# Patient Record
Sex: Female | Born: 1985 | Race: White | Hispanic: No | Marital: Married | Smoking: Former smoker
Health system: Southern US, Community
[De-identification: ages and names within clinical notes are randomized; demographics above are authoritative.]

## PROBLEM LIST (undated history)

## (undated) DIAGNOSIS — Z1331 Encounter for screening for depression: Secondary | ICD-10-CM

## (undated) DIAGNOSIS — S46812A Strain of other muscles, fascia and tendons at shoulder and upper arm level, left arm, initial encounter: Secondary | ICD-10-CM

## (undated) DIAGNOSIS — S91119A Laceration without foreign body of unspecified toe without damage to nail, initial encounter: Secondary | ICD-10-CM

## (undated) DIAGNOSIS — K529 Noninfective gastroenteritis and colitis, unspecified: Secondary | ICD-10-CM

## (undated) DIAGNOSIS — N921 Excessive and frequent menstruation with irregular cycle: Secondary | ICD-10-CM

## (undated) DIAGNOSIS — R0789 Other chest pain: Secondary | ICD-10-CM

## (undated) DIAGNOSIS — F419 Anxiety disorder, unspecified: Secondary | ICD-10-CM

## (undated) DIAGNOSIS — J019 Acute sinusitis, unspecified: Secondary | ICD-10-CM

## (undated) DIAGNOSIS — R110 Nausea: Secondary | ICD-10-CM

## (undated) DIAGNOSIS — R55 Syncope and collapse: Secondary | ICD-10-CM

## (undated) DIAGNOSIS — N643 Galactorrhea not associated with childbirth: Secondary | ICD-10-CM

## (undated) DIAGNOSIS — H538 Other visual disturbances: Secondary | ICD-10-CM

## (undated) DIAGNOSIS — N939 Abnormal uterine and vaginal bleeding, unspecified: Secondary | ICD-10-CM

## (undated) DIAGNOSIS — R51 Headache: Secondary | ICD-10-CM

## (undated) DIAGNOSIS — Z720 Tobacco use: Secondary | ICD-10-CM

## (undated) DIAGNOSIS — N926 Irregular menstruation, unspecified: Secondary | ICD-10-CM

## (undated) DIAGNOSIS — R0781 Pleurodynia: Secondary | ICD-10-CM

## (undated) DIAGNOSIS — W57XXXA Bitten or stung by nonvenomous insect and other nonvenomous arthropods, initial encounter: Secondary | ICD-10-CM

## (undated) DIAGNOSIS — S43401A Unspecified sprain of right shoulder joint, initial encounter: Secondary | ICD-10-CM

## (undated) DIAGNOSIS — H11153 Pinguecula, bilateral: Secondary | ICD-10-CM

## (undated) DIAGNOSIS — R768 Other specified abnormal immunological findings in serum: Secondary | ICD-10-CM

## (undated) DIAGNOSIS — L723 Sebaceous cyst: Secondary | ICD-10-CM

## (undated) DIAGNOSIS — R011 Cardiac murmur, unspecified: Secondary | ICD-10-CM

## (undated) DIAGNOSIS — R002 Palpitations: Secondary | ICD-10-CM

## (undated) DIAGNOSIS — F329 Major depressive disorder, single episode, unspecified: Secondary | ICD-10-CM

## (undated) DIAGNOSIS — L918 Other hypertrophic disorders of the skin: Secondary | ICD-10-CM

## (undated) DIAGNOSIS — R519 Headache, unspecified: Secondary | ICD-10-CM

## (undated) DIAGNOSIS — R109 Unspecified abdominal pain: Secondary | ICD-10-CM

## (undated) DIAGNOSIS — N809 Endometriosis, unspecified: Secondary | ICD-10-CM

## (undated) DIAGNOSIS — K589 Irritable bowel syndrome without diarrhea: Secondary | ICD-10-CM

## (undated) DIAGNOSIS — N390 Urinary tract infection, site not specified: Secondary | ICD-10-CM

## (undated) DIAGNOSIS — R102 Pelvic and perineal pain: Secondary | ICD-10-CM

## (undated) DIAGNOSIS — M791 Myalgia, unspecified site: Secondary | ICD-10-CM

## (undated) HISTORY — DX: Tobacco use: Z72.0

## (undated) HISTORY — DX: Nausea: R11.0

## (undated) HISTORY — DX: Encounter for screening for depression: Z13.31

## (undated) HISTORY — DX: Other specified abnormal immunological findings in serum: R76.8

## (undated) HISTORY — DX: Other hypertrophic disorders of the skin: L91.8

## (undated) HISTORY — DX: Urinary tract infection, site not specified: N39.0

## (undated) HISTORY — DX: Other chest pain: R07.89

## (undated) HISTORY — DX: Pleurodynia: R07.81

## (undated) HISTORY — PX: OTHER SURGICAL HISTORY: SHX169

## (undated) HISTORY — DX: Irritable bowel syndrome without diarrhea: K58.9

## (undated) HISTORY — PX: TONSILLECTOMY: SUR1361

## (undated) HISTORY — DX: Noninfective gastroenteritis and colitis, unspecified: K52.9

## (undated) HISTORY — DX: Acute sinusitis, unspecified: J01.90

## (undated) HISTORY — DX: Sebaceous cyst: L72.3

## (undated) HISTORY — DX: Strain of other muscles, fascia and tendons at shoulder and upper arm level, left arm, initial encounter: S46.812A

## (undated) HISTORY — DX: Headache: R51

## (undated) HISTORY — DX: Unspecified sprain of right shoulder joint, initial encounter: S43.401A

## (undated) HISTORY — DX: Anxiety disorder, unspecified: F41.9

## (undated) HISTORY — DX: Irregular menstruation, unspecified: N92.6

## (undated) HISTORY — PX: WISDOM TOOTH EXTRACTION: SHX21

## (undated) HISTORY — DX: Bitten or stung by nonvenomous insect and other nonvenomous arthropods, initial encounter: W57.XXXA

## (undated) HISTORY — DX: Endometriosis, unspecified: N80.9

## (undated) HISTORY — DX: Syncope and collapse: R55

## (undated) HISTORY — DX: Pelvic and perineal pain: R10.2

## (undated) HISTORY — DX: Palpitations: R00.2

## (undated) HISTORY — DX: Laceration without foreign body of unspecified toe without damage to nail, initial encounter: S91.119A

## (undated) HISTORY — DX: Other visual disturbances: H53.8

## (undated) HISTORY — DX: Galactorrhea not associated with childbirth: N64.3

## (undated) HISTORY — DX: Excessive and frequent menstruation with irregular cycle: N92.1

## (undated) HISTORY — DX: Major depressive disorder, single episode, unspecified: F32.9

## (undated) HISTORY — DX: Headache, unspecified: R51.9

## (undated) HISTORY — DX: Myalgia, unspecified site: M79.10

## (undated) HISTORY — DX: Pinguecula, bilateral: H11.153

## (undated) HISTORY — DX: Abnormal uterine and vaginal bleeding, unspecified: N93.9

---
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
## 2007-01-01 ENCOUNTER — Encounter: Admission: RE | Admit: 2007-01-01 | Discharge: 2007-01-01 | Payer: Self-pay | Admitting: Family Medicine

## 2007-01-08 ENCOUNTER — Emergency Department (HOSPITAL_COMMUNITY): Admission: EM | Admit: 2007-01-08 | Discharge: 2007-01-08 | Payer: Self-pay | Admitting: Emergency Medicine

## 2007-02-28 ENCOUNTER — Encounter: Admission: RE | Admit: 2007-02-28 | Discharge: 2007-02-28 | Payer: Self-pay | Admitting: Family Medicine

## 2007-07-17 ENCOUNTER — Emergency Department (HOSPITAL_COMMUNITY): Admission: EM | Admit: 2007-07-17 | Discharge: 2007-07-17 | Payer: Self-pay | Admitting: Emergency Medicine

## 2007-08-04 ENCOUNTER — Emergency Department (HOSPITAL_COMMUNITY): Admission: EM | Admit: 2007-08-04 | Discharge: 2007-08-04 | Payer: Self-pay | Admitting: Emergency Medicine

## 2007-12-02 ENCOUNTER — Emergency Department (HOSPITAL_COMMUNITY): Admission: EM | Admit: 2007-12-02 | Discharge: 2007-12-02 | Payer: Self-pay | Admitting: Emergency Medicine

## 2008-01-20 ENCOUNTER — Emergency Department (HOSPITAL_COMMUNITY): Admission: EM | Admit: 2008-01-20 | Discharge: 2008-01-20 | Payer: Self-pay | Admitting: Emergency Medicine

## 2008-03-29 ENCOUNTER — Emergency Department (HOSPITAL_COMMUNITY): Admission: EM | Admit: 2008-03-29 | Discharge: 2008-03-29 | Payer: Self-pay | Admitting: Emergency Medicine

## 2009-06-17 ENCOUNTER — Emergency Department (HOSPITAL_COMMUNITY): Admission: EM | Admit: 2009-06-17 | Discharge: 2009-06-17 | Payer: Self-pay | Admitting: Emergency Medicine

## 2010-06-18 ENCOUNTER — Ambulatory Visit (HOSPITAL_COMMUNITY): Admission: RE | Admit: 2010-06-18 | Discharge: 2010-06-18 | Payer: Self-pay | Admitting: Obstetrics and Gynecology

## 2010-07-25 ENCOUNTER — Emergency Department (HOSPITAL_COMMUNITY): Admission: EM | Admit: 2010-07-25 | Discharge: 2010-07-25 | Payer: Self-pay | Admitting: Emergency Medicine

## 2010-10-23 ENCOUNTER — Inpatient Hospital Stay (HOSPITAL_COMMUNITY)
Admission: AD | Admit: 2010-10-23 | Discharge: 2010-10-23 | Payer: Self-pay | Source: Home / Self Care | Admitting: Obstetrics & Gynecology

## 2010-11-01 ENCOUNTER — Inpatient Hospital Stay (HOSPITAL_COMMUNITY)
Admission: AD | Admit: 2010-11-01 | Discharge: 2010-11-03 | Payer: Self-pay | Source: Home / Self Care | Attending: Obstetrics & Gynecology | Admitting: Obstetrics & Gynecology

## 2010-12-12 ENCOUNTER — Encounter: Payer: Self-pay | Admitting: Family Medicine

## 2011-01-31 LAB — CBC
HCT: 27.1 % — ABNORMAL LOW (ref 36.0–46.0)
Hemoglobin: 11.8 g/dL — ABNORMAL LOW (ref 12.0–15.0)
Hemoglobin: 9.5 g/dL — ABNORMAL LOW (ref 12.0–15.0)
MCH: 30.8 pg (ref 26.0–34.0)
MCH: 31.7 pg (ref 26.0–34.0)
MCHC: 35 g/dL (ref 30.0–36.0)
MCV: 90.1 fL (ref 78.0–100.0)
MCV: 90.6 fL (ref 78.0–100.0)
RBC: 3.82 MIL/uL — ABNORMAL LOW (ref 3.87–5.11)

## 2011-01-31 LAB — URINALYSIS, ROUTINE W REFLEX MICROSCOPIC
Bilirubin Urine: NEGATIVE
Hgb urine dipstick: NEGATIVE
Ketones, ur: NEGATIVE mg/dL
Specific Gravity, Urine: <1.005 — ABNORMAL LOW (ref 1.005–1.030)
pH: 7 (ref 5.0–8.0)

## 2011-02-03 LAB — WET PREP, GENITAL
WBC, Wet Prep HPF POC: NONE SEEN
Yeast Wet Prep HPF POC: NONE SEEN

## 2011-02-03 LAB — URINALYSIS, ROUTINE W REFLEX MICROSCOPIC
Bilirubin Urine: NEGATIVE
Glucose, UA: NEGATIVE mg/dL
Hgb urine dipstick: NEGATIVE
Ketones, ur: NEGATIVE mg/dL
Protein, ur: NEGATIVE mg/dL

## 2011-02-03 LAB — DIFFERENTIAL
Basophils Absolute: 0 10*3/uL (ref 0.0–0.1)
Basophils Relative: 0 % (ref 0–1)
Monocytes Relative: 4 % (ref 3–12)
Neutro Abs: 7.8 10*3/uL — ABNORMAL HIGH (ref 1.7–7.7)
Neutrophils Relative %: 81 % — ABNORMAL HIGH (ref 43–77)

## 2011-02-03 LAB — BASIC METABOLIC PANEL
Calcium: 8.6 mg/dL (ref 8.4–10.5)
GFR calc non Af Amer: >60 mL/min (ref >60)
Glucose, Bld: 79 mg/dL (ref 70–99)
Sodium: 136 mEq/L (ref 135–145)

## 2011-02-03 LAB — CBC
Hemoglobin: 10.6 g/dL — ABNORMAL LOW (ref 12.0–15.0)
MCHC: 34.8 g/dL (ref 30.0–36.0)
RDW: 12.6 % (ref 11.5–15.5)

## 2011-02-03 LAB — GC/CHLAMYDIA PROBE AMP, GENITAL: GC Probe Amp, Genital: NEGATIVE

## 2011-07-29 ENCOUNTER — Other Ambulatory Visit: Payer: Self-pay | Admitting: Emergency Medicine

## 2011-07-29 ENCOUNTER — Emergency Department (HOSPITAL_COMMUNITY)
Admission: EM | Admit: 2011-07-29 | Discharge: 2011-07-30 | Disposition: A | Discharge status: Home / Self Care | Payer: Medicaid Other | Attending: Emergency Medicine | Admitting: Emergency Medicine

## 2011-07-29 DIAGNOSIS — R5381 Other malaise: Secondary | ICD-10-CM | POA: Insufficient documentation

## 2011-07-29 DIAGNOSIS — N898 Other specified noninflammatory disorders of vagina: Secondary | ICD-10-CM | POA: Insufficient documentation

## 2011-07-29 DIAGNOSIS — R109 Unspecified abdominal pain: Secondary | ICD-10-CM | POA: Insufficient documentation

## 2011-07-29 DIAGNOSIS — M545 Low back pain, unspecified: Secondary | ICD-10-CM | POA: Insufficient documentation

## 2011-07-29 LAB — POCT PREGNANCY, URINE: Preg Test, Ur: NEGATIVE

## 2011-08-01 LAB — URINALYSIS, ROUTINE W REFLEX MICROSCOPIC
Bilirubin Urine: NEGATIVE
Glucose, UA: NEGATIVE mg/dL
Ketones, ur: NEGATIVE mg/dL
Leukocytes, UA: NEGATIVE
Nitrite: NEGATIVE
Protein, ur: NEGATIVE mg/dL
Specific Gravity, Urine: 1.012 (ref 1.005–1.030)
Urobilinogen, UA: 0.2 mg/dL (ref 0.0–1.0)
pH: 6.5 (ref 5.0–8.0)

## 2011-08-01 LAB — PROTIME-INR
INR: 1.14 (ref 0.00–1.49)
Prothrombin Time: 14.8 s (ref 11.6–15.2)

## 2011-08-01 LAB — DIFFERENTIAL
Basophils Absolute: 0.1 K/uL (ref 0.0–0.1)
Basophils Relative: 1 % (ref 0–1)
Eosinophils Absolute: 0.2 K/uL (ref 0.0–0.7)
Eosinophils Relative: 3 % (ref 0–5)
Lymphocytes Relative: 44 % (ref 12–46)
Lymphs Abs: 2.6 K/uL (ref 0.7–4.0)
Monocytes Absolute: 0.3 K/uL (ref 0.1–1.0)
Monocytes Relative: 6 % (ref 3–12)
Neutro Abs: 2.8 K/uL (ref 1.7–7.7)
Neutrophils Relative %: 47 % (ref 43–77)

## 2011-08-01 LAB — CBC
HCT: 35.4 % — ABNORMAL LOW (ref 36.0–46.0)
Hemoglobin: 12.6 g/dL (ref 12.0–15.0)
MCH: 30.1 pg (ref 26.0–34.0)
MCV: 84.7 fL (ref 78.0–100.0)
RBC: 4.18 MIL/uL (ref 3.87–5.11)

## 2011-08-01 LAB — URINE MICROSCOPIC-ADD ON

## 2011-08-01 LAB — GC/CHLAMYDIA PROBE AMP, GENITAL: GC Probe Amp, Genital: NEGATIVE

## 2011-08-15 LAB — WET PREP, GENITAL: Yeast Wet Prep HPF POC: NONE SEEN

## 2011-08-15 LAB — POCT PREGNANCY, URINE
Operator id: 284141
Preg Test, Ur: NEGATIVE

## 2011-08-15 LAB — CBC
HCT: 36.6
MCHC: 34.2
MCV: 87.9
Platelets: 272
RDW: 12.7
WBC: 10

## 2011-08-15 LAB — URINALYSIS, ROUTINE W REFLEX MICROSCOPIC
Ketones, ur: NEGATIVE
Nitrite: NEGATIVE
Specific Gravity, Urine: 1.015
Urobilinogen, UA: 0.2
pH: 7.5

## 2011-08-15 LAB — COMPREHENSIVE METABOLIC PANEL
Albumin: 3.9
BUN: 5 — ABNORMAL LOW
Calcium: 9
Chloride: 109
Creatinine, Ser: 0.66
Total Bilirubin: 0.5

## 2011-08-15 LAB — DIFFERENTIAL
Basophils Absolute: 0
Lymphocytes Relative: 16
Monocytes Absolute: 0.5
Neutro Abs: 7.8 — ABNORMAL HIGH

## 2011-08-15 LAB — RPR: RPR Ser Ql: NONREACTIVE

## 2011-08-15 LAB — GC/CHLAMYDIA PROBE AMP, GENITAL: GC Probe Amp, Genital: NEGATIVE

## 2011-09-02 LAB — DIFFERENTIAL
Lymphocytes Relative: 20
Lymphs Abs: 1.6
Neutrophils Relative %: 71

## 2011-09-02 LAB — PREGNANCY, URINE: Preg Test, Ur: NEGATIVE

## 2011-09-02 LAB — I-STAT 8, (EC8 V) (CONVERTED LAB)
BUN: 10
Bicarbonate: 23.2
HCT: 39
Hemoglobin: 13.3
Operator id: 277751
pCO2, Ven: 33.8 — ABNORMAL LOW

## 2011-09-02 LAB — URINALYSIS, ROUTINE W REFLEX MICROSCOPIC
Nitrite: NEGATIVE
Specific Gravity, Urine: 1.017
pH: 6

## 2011-09-02 LAB — CBC
HCT: 35.3 — ABNORMAL LOW
Platelets: 263
WBC: 7.9

## 2011-09-02 LAB — POCT I-STAT CREATININE: Creatinine, Ser: 0.8

## 2012-12-03 ENCOUNTER — Encounter (HOSPITAL_COMMUNITY): Payer: Self-pay

## 2012-12-03 ENCOUNTER — Emergency Department (HOSPITAL_COMMUNITY)
Admission: EM | Admit: 2012-12-03 | Discharge: 2012-12-03 | Disposition: A | Discharge status: Home / Self Care | Payer: Self-pay | Attending: Emergency Medicine | Admitting: Emergency Medicine

## 2012-12-03 ENCOUNTER — Emergency Department (HOSPITAL_COMMUNITY): Payer: Self-pay

## 2012-12-03 DIAGNOSIS — IMO0002 Reserved for concepts with insufficient information to code with codable children: Secondary | ICD-10-CM

## 2012-12-03 DIAGNOSIS — F17200 Nicotine dependence, unspecified, uncomplicated: Secondary | ICD-10-CM | POA: Insufficient documentation

## 2012-12-03 DIAGNOSIS — Y9389 Activity, other specified: Secondary | ICD-10-CM | POA: Insufficient documentation

## 2012-12-03 DIAGNOSIS — R011 Cardiac murmur, unspecified: Secondary | ICD-10-CM | POA: Insufficient documentation

## 2012-12-03 DIAGNOSIS — S51809A Unspecified open wound of unspecified forearm, initial encounter: Secondary | ICD-10-CM | POA: Insufficient documentation

## 2012-12-03 DIAGNOSIS — Y929 Unspecified place or not applicable: Secondary | ICD-10-CM | POA: Insufficient documentation

## 2012-12-03 DIAGNOSIS — W268XXA Contact with other sharp object(s), not elsewhere classified, initial encounter: Secondary | ICD-10-CM | POA: Insufficient documentation

## 2012-12-03 HISTORY — DX: Cardiac murmur, unspecified: R01.1

## 2012-12-03 MED ORDER — HYDROCODONE-ACETAMINOPHEN 5-325 MG PO TABS: 2.0000 | ORAL_TABLET | ORAL | Status: DC | PRN

## 2012-12-03 NOTE — ED Provider Notes (Signed)
History   This chart was scribed for Doug Sou, MD by Gerlean Ren, ED Scribe. This patient was seen in room TR08C/TR08C and the patient's care was started at 9:50 PM    CSN: [id number]  Arrival date & time 12/03/12  2040   First MD Initiated Contact with Patient 12/03/12 2148      Chief Complaint  Patient presents with  . Extremity Laceration    The history is provided by the patient. No language interpreter was used.   Krista Rodriguez is a 27 y.o. female with no chronic medical conditions who presents to the Emergency Department for a laceration over right forearm after punching through a glass window at 8:00 PM this evening.  Pt states tetanus is up-to-date.  Pt denies any further injuries as a result.  Pt is a current someday smoker and reports alcohol use. Past Medical History  Diagnosis Date  . Heart murmur     Past Surgical History  Procedure Date  . Tonsillectomy   . Addenoidectomy     History reviewed. No pertinent family history.  History  Substance Use Topics  . Smoking status: Current Some Day Smoker  . Smokeless tobacco: Not on file  . Alcohol Use: Yes    No OB history provided.   Review of Systems  Constitutional: Negative.   HENT: Negative.   Respiratory: Negative.   Cardiovascular: Negative.   Gastrointestinal: Negative.   Musculoskeletal: Negative.   Skin: Positive for wound.  Neurological: Negative.   Hematological: Negative.   Psychiatric/Behavioral: Negative.     Allergies  Icy hot  Home Medications  No current outpatient prescriptions on file.  BP 115/82  Pulse 80  Temp 98.3 F (36.8 C) (Oral)  Resp 16  SpO2 100%  LMP 11/22/2012  Physical Exam  Nursing note and vitals reviewed. Constitutional: She appears well-developed and well-nourished.  HENT:  Head: Normocephalic and atraumatic.  Eyes: Conjunctivae normal are normal. Pupils are equal, round, and reactive to light.  Neck: Neck supple. No tracheal deviation present. No  thyromegaly present.  Cardiovascular: Normal rate and regular rhythm.   No murmur heard. Pulmonary/Chest: Effort normal and breath sounds normal.  Abdominal: Soft. Bowel sounds are normal. She exhibits no distension. There is no tenderness.  Musculoskeletal: Normal range of motion. She exhibits no edema and no tenderness.       Right upper extremity 5 cm laceration with muscle at base full range of motion neurovascularly intact  Neurological: She is alert. Coordination normal.  Skin: Skin is warm and dry. No rash noted.  Psychiatric: She has a normal mood and affect.    ED Course  Procedures (including critical care time) DIAGNOSTIC STUDIES: Oxygen Saturation is 100% on room air, normal by my interpretation.    COORDINATION OF CARE: 9:54 PM- Patient informed of clinical course, understands medical decision-making process, and agrees with plan.   No results found.   No diagnosis found.   X-rays reviewed by me  LACERATION REPAIR Performed by: Doug Sou Authorized by: Doug Sou Consent: Verbal consent obtained. Risks and benefits: risks, benefits and alternatives were discussed Consent given by: patient Patient identity confirmed: provided demographic data Prepped and Draped in normal sterile fashion Wound explored  Laceration Location: Right forearm  Laceration Length: 5cm  No Foreign Bodies seen or palpated  Anesthesia: local infiltration  Local anesthetic: lidocaine2% without epinephrine  Anesthetic total: 3 ml  Irrigation method: Copious tap water  Amount of cleaning: standard  Skin closure: 4-0 prolene  Number of sutures:  5  Technique: Simple interrupted   Patient tolerance: Patient tolerated the procedure well with no immediate complications. MDM  Plan sutures out one week. Prescription Norco. Diagnosis  5 cmlaceration right forearm  I personally performed the services described in this documentation, which was scribed in my presence. The  recorded information has been reviewed and is accurate.         Doug Sou, MD 12/03/12 2259

## 2012-12-03 NOTE — ED Notes (Signed)
Cleaned wound to right anterior forearm with EZ clean spray and covered with saline gauze.

## 2012-12-03 NOTE — ED Notes (Addendum)
Pt reports she was upset and punched her hand through a window approx 45 mins ago, pt presents w/approx 4 cm lac in length and 1 cm in width to (R) forearm, bleeding controlled, area dressed. Positive sensation and pulse below extremity

## 2013-09-06 ENCOUNTER — Encounter (HOSPITAL_COMMUNITY): Payer: Self-pay | Admitting: Emergency Medicine

## 2013-09-06 ENCOUNTER — Emergency Department (HOSPITAL_COMMUNITY)
Admission: EM | Admit: 2013-09-06 | Discharge: 2013-09-06 | Disposition: A | Discharge status: Home / Self Care | Payer: Medicaid Other | Attending: Emergency Medicine | Admitting: Emergency Medicine

## 2013-09-06 DIAGNOSIS — R51 Headache: Secondary | ICD-10-CM | POA: Insufficient documentation

## 2013-09-06 DIAGNOSIS — F17200 Nicotine dependence, unspecified, uncomplicated: Secondary | ICD-10-CM | POA: Insufficient documentation

## 2013-09-06 DIAGNOSIS — R011 Cardiac murmur, unspecified: Secondary | ICD-10-CM | POA: Insufficient documentation

## 2013-09-06 MED ORDER — METOCLOPRAMIDE HCL 10 MG PO TABS: 10.0000 mg | ORAL_TABLET | Freq: Four times a day (QID) | ORAL | Status: DC | PRN

## 2013-09-06 NOTE — ED Notes (Signed)
Head started burning and getting tightly, foggy vision; h/a x 1 week. - migraine x 1 mos. Also, sob.

## 2013-09-06 NOTE — ED Provider Notes (Signed)
CSN: [id number]     Arrival date & time 09/06/13  0100 History   First MD Initiated Contact with Patient 09/06/13 0159     Chief Complaint  Patient presents with  . Headache   (Consider location/radiation/quality/duration/timing/severity/associated sxs/prior Treatment) HPI Over last year gets headaches once a month, this one gradual onset several days was mild worse tonight moderate, no treatment PTA, sometimes takes Exedrin migraine which helps, no sudden onset, no trauma, no fever, no focal neuro Sxs Past Medical History  Diagnosis Date  . Heart murmur    Past Surgical History  Procedure Laterality Date  . Tonsillectomy    . Addenoidectomy     History reviewed. No pertinent family history. History  Substance Use Topics  . Smoking status: Current Some Day Smoker  . Smokeless tobacco: Not on file  . Alcohol Use: Yes   OB History   Grav Para Term Preterm Abortions TAB SAB Ect Mult Living                 Review of Systems 10 Systems reviewed and are negative for acute change except as noted in the HPI. Allergies  Icy hot  Home Medications   Current Outpatient Rx  Name  Route  Sig  Dispense  Refill  . HYDROcodone-acetaminophen (NORCO/VICODIN) 5-325 MG per tablet   Oral   Take 2 tablets by mouth every 4 (four) hours as needed for pain.   6 tablet   0   . metoCLOPramide (REGLAN) 10 MG tablet   Oral   Take 1 tablet (10 mg total) by mouth every 6 (six) hours as needed (nausea/headache).   4 tablet   0    BP 105/55  Pulse 52  Temp(Src) 97.7 F (36.5 C) (Oral)  Resp 18  SpO2 100% Physical Exam  Nursing note and vitals reviewed. Constitutional:  Awake, alert, nontoxic appearance with baseline speech for patient.  HENT:  Head: Atraumatic.  Mouth/Throat: No oropharyngeal exudate.  Eyes: EOM are normal. Pupils are equal, round, and reactive to light. Right eye exhibits no discharge. Left eye exhibits no discharge.  Neck: Neck supple.  Cardiovascular: Normal  rate and regular rhythm.   No murmur heard. Pulmonary/Chest: Effort normal and breath sounds normal. No stridor. No respiratory distress. She has no wheezes. She has no rales. She exhibits no tenderness.  Abdominal: Soft. Bowel sounds are normal. She exhibits no mass. There is no tenderness. There is no rebound.  Musculoskeletal: She exhibits no tenderness.  Baseline ROM, moves extremities with no obvious new focal weakness.  Lymphadenopathy:    She has no cervical adenopathy.  Neurological:  Awake, alert, cooperative and aware of situation; motor strength bilaterally; sensation normal to light touch bilaterally; peripheral visual fields full to confrontation; no facial asymmetry; tongue midline; major cranial nerves appear intact; no pronator drift, normal finger to nose bilaterally, baseline gait without new ataxia.  Skin: No rash noted.  Psychiatric: She has a normal mood and affect.    ED Course  Procedures (including critical care time) Labs Review Labs Reviewed - No data to display Imaging Review No results found.  EKG Interpretation   None       MDM   1. Headache    I doubt any other EMC precluding discharge at this time including, but not necessarily limited to the following:SAH, CVA, SBI.    Hurman Horn, MD 09/22/13 2132

## 2014-08-15 ENCOUNTER — Other Ambulatory Visit: Payer: Self-pay | Admitting: Family Medicine

## 2014-08-15 DIAGNOSIS — N920 Excessive and frequent menstruation with regular cycle: Secondary | ICD-10-CM

## 2014-08-21 ENCOUNTER — Ambulatory Visit
Admission: RE | Admit: 2014-08-21 | Discharge: 2014-08-21 | Disposition: A | Discharge status: Home / Self Care | Payer: 59 | Source: Ambulatory Visit | Attending: Family Medicine | Admitting: Family Medicine

## 2014-08-21 DIAGNOSIS — N920 Excessive and frequent menstruation with regular cycle: Secondary | ICD-10-CM

## 2014-10-23 ENCOUNTER — Other Ambulatory Visit: Payer: Self-pay | Admitting: Occupational Medicine

## 2014-10-23 ENCOUNTER — Ambulatory Visit: Payer: Self-pay

## 2014-10-23 DIAGNOSIS — M79671 Pain in right foot: Secondary | ICD-10-CM

## 2016-01-21 ENCOUNTER — Encounter (HOSPITAL_COMMUNITY): Payer: Self-pay | Admitting: Emergency Medicine

## 2016-01-21 ENCOUNTER — Emergency Department (HOSPITAL_COMMUNITY)
Admission: EM | Admit: 2016-01-21 | Discharge: 2016-01-21 | Disposition: A | Discharge status: Home / Self Care | Payer: BLUE CROSS/BLUE SHIELD | Attending: Emergency Medicine | Admitting: Emergency Medicine

## 2016-01-21 DIAGNOSIS — Z79899 Other long term (current) drug therapy: Secondary | ICD-10-CM | POA: Diagnosis not present

## 2016-01-21 DIAGNOSIS — F17200 Nicotine dependence, unspecified, uncomplicated: Secondary | ICD-10-CM | POA: Diagnosis not present

## 2016-01-21 DIAGNOSIS — R1030 Lower abdominal pain, unspecified: Secondary | ICD-10-CM | POA: Insufficient documentation

## 2016-01-21 DIAGNOSIS — R011 Cardiac murmur, unspecified: Secondary | ICD-10-CM | POA: Insufficient documentation

## 2016-01-21 DIAGNOSIS — R55 Syncope and collapse: Secondary | ICD-10-CM | POA: Diagnosis not present

## 2016-01-21 DIAGNOSIS — Z3202 Encounter for pregnancy test, result negative: Secondary | ICD-10-CM | POA: Diagnosis not present

## 2016-01-21 HISTORY — DX: Unspecified abdominal pain: R10.9

## 2016-01-21 LAB — CBC WITH DIFFERENTIAL/PLATELET
BASOS ABS: 0 10*3/uL (ref 0.0–0.1)
Basophils Relative: 0 %
EOS PCT: 1 %
Eosinophils Absolute: 0.1 10*3/uL (ref 0.0–0.7)
HCT: 37.4 % (ref 36.0–46.0)
Hemoglobin: 12.7 g/dL (ref 12.0–15.0)
LYMPHS ABS: 2.2 10*3/uL (ref 0.7–4.0)
LYMPHS PCT: 23 %
MCH: 29.1 pg (ref 26.0–34.0)
MCHC: 34 g/dL (ref 30.0–36.0)
MCV: 85.6 fL (ref 78.0–100.0)
MONO ABS: 0.5 10*3/uL (ref 0.1–1.0)
Monocytes Relative: 6 %
Neutro Abs: 6.4 10*3/uL (ref 1.7–7.7)
Neutrophils Relative %: 70 %
PLATELETS: 290 10*3/uL (ref 150–400)
RBC: 4.37 MIL/uL (ref 3.87–5.11)
RDW: 13.2 % (ref 11.5–15.5)
WBC: 9.2 10*3/uL (ref 4.0–10.5)

## 2016-01-21 LAB — COMPREHENSIVE METABOLIC PANEL
ALT: 13 U/L — ABNORMAL LOW (ref 14–54)
AST: 15 U/L (ref 15–41)
Albumin: 3.6 g/dL (ref 3.5–5.0)
Alkaline Phosphatase: 48 U/L (ref 38–126)
Anion gap: 10 (ref 5–15)
BUN: 13 mg/dL (ref 6–20)
CALCIUM: 9 mg/dL (ref 8.9–10.3)
CHLORIDE: 106 mmol/L (ref 101–111)
CO2: 22 mmol/L (ref 22–32)
Creatinine, Ser: 0.7 mg/dL (ref 0.44–1.00)
GLUCOSE: 81 mg/dL (ref 65–99)
POTASSIUM: 3.8 mmol/L (ref 3.5–5.1)
Sodium: 138 mmol/L (ref 135–145)
Total Bilirubin: 0.4 mg/dL (ref 0.3–1.2)
Total Protein: 6.2 g/dL — ABNORMAL LOW (ref 6.5–8.1)

## 2016-01-21 LAB — URINALYSIS, ROUTINE W REFLEX MICROSCOPIC
BILIRUBIN URINE: NEGATIVE
Glucose, UA: NEGATIVE mg/dL
Hgb urine dipstick: NEGATIVE
KETONES UR: NEGATIVE mg/dL
LEUKOCYTES UA: NEGATIVE
NITRITE: NEGATIVE
PH: 6 (ref 5.0–8.0)
Protein, ur: NEGATIVE mg/dL
SPECIFIC GRAVITY, URINE: 1.012 (ref 1.005–1.030)

## 2016-01-21 LAB — WET PREP, GENITAL
Clue Cells Wet Prep HPF POC: NONE SEEN
Sperm: NONE SEEN
TRICH WET PREP: NONE SEEN
YEAST WET PREP: NONE SEEN

## 2016-01-21 LAB — LIPASE, BLOOD: Lipase: 24 U/L (ref 11–51)

## 2016-01-21 LAB — POC URINE PREG, ED: PREG TEST UR: NEGATIVE

## 2016-01-21 MED ORDER — SODIUM CHLORIDE 0.9 % IV BOLUS (SEPSIS)
1000.0000 mL | Freq: Once | INTRAVENOUS | Status: AC
  Administered 2016-01-21: 1000 mL via INTRAVENOUS

## 2016-01-21 NOTE — ED Notes (Signed)
Pt arrives by [facility] post syncopal episode. About an hour ago, pt had extreme episode of lower abdominal pain and went to bathroom. She had a hot flash and next thing she remembers was boyfriend finding her. Pt c/o of headache and abdominal tenderness for EMS. 12 lead unremarkable, last vitals 108/72, HR 86, 100% RA, CBG 86. EMS notes that pt is seeing an MD regarding abdominal pain, but pt has never had a syncopal episode related to the pain.

## 2016-01-21 NOTE — ED Provider Notes (Signed)
CSN: [id number]     Arrival date & time 01/21/16  0500 History   First MD Initiated Contact with Patient 01/21/16 [phone]     Chief Complaint  Patient presents with  . Loss of Consciousness  . Abdominal Pain     (Consider location/radiation/quality/duration/timing/severity/associated sxs/prior Treatment) HPI  This is a 30 year old female with a history of chronic abdominal pain who presents with an episode of syncope. Patient reports that she had onset of acute abdominal pain this morning. It was over her lower abdomen and sharp in nature.  She states currently she just feels "sore." No vomiting or diarrhea. She has a history of similar pain in the past which has been attributed to likely endometriosis. She's not had pain in months. She states that she went to go urinate. She felt hot and the next thing she remembers was her boyfriend finding her. She was alert when she woke up.  Denied any chest pain or shortness of breath. Denies any vaginal discharge or concern for STDs. Last menstrual period was February 14.  Past Medical History  Diagnosis Date  . Heart murmur   . Abdominal pain    Past Surgical History  Procedure Laterality Date  . Tonsillectomy    . Addenoidectomy     History reviewed. No pertinent family history. Social History  Substance Use Topics  . Smoking status: Current Some Day Smoker  . Smokeless tobacco: None  . Alcohol Use: Yes   OB History    No data available     Review of Systems  Constitutional: Negative for fever.  Respiratory: Negative for shortness of breath.   Cardiovascular: Negative for chest pain.  Gastrointestinal: Positive for abdominal pain. Negative for nausea, vomiting and blood in stool.  Genitourinary: Negative for dysuria, vaginal bleeding and vaginal discharge.  All other systems reviewed and are negative.     Allergies  Icy hot  Home Medications   Prior to Admission medications   Medication Sig Start Date End Date Taking?  Authorizing Provider  norethindrone (ERRIN) 0.35 MG tablet Take 1 tablet by mouth daily.   Yes Historical Provider, MD   BP 106/60 mmHg  Pulse 63  Resp 20  SpO2 99%  LMP 01/05/2016 Physical Exam  Constitutional: She is oriented to person, place, and time. She appears well-developed and well-nourished. No distress.  HENT:  Head: Normocephalic and atraumatic.  Cardiovascular: Normal rate and regular rhythm.   Murmur heard. Pulmonary/Chest: Effort normal and breath sounds normal. No respiratory distress. She has no wheezes.  Abdominal: Soft. Bowel sounds are normal. There is tenderness. There is no rebound and no guarding.  Mild lower abdominal tenderness to palpation without rebound or guarding  Genitourinary:  Normal external vaginal exam, no significant vaginal discharge, multi parous cervix, no adnexal tenderness, there is tenderness to palpation with a patient of the posterior fornix, no cervical motion tenderness  Neurological: She is alert and oriented to person, place, and time.  Skin: Skin is warm and dry.  Psychiatric: She has a normal mood and affect.  Nursing note and vitals reviewed.   ED Course  Procedures (including critical care time) Labs Review Labs Reviewed  COMPREHENSIVE METABOLIC PANEL - Abnormal; Notable for the following:    Total Protein 6.2 (*)    ALT 13 (*)    All other components within normal limits  WET PREP, GENITAL  CBC WITH DIFFERENTIAL/PLATELET  LIPASE, BLOOD  URINALYSIS, ROUTINE W REFLEX MICROSCOPIC (NOT AT [facility])  POC URINE PREG, ED  GC/CHLAMYDIA  PROBE AMP ([place]) NOT AT [facility]    Imaging Review No results found. I have personally reviewed and evaluated these images and lab results as part of my medical decision-making.   EKG Interpretation   Date/Time:  Thursday January 21 2016 05:29:29 EST Ventricular Rate:  65 PR Interval:  189 QRS Duration: 92 QT Interval:  393 QTC Calculation: 409 R Axis:   81 Text Interpretation:  Sinus  rhythm Confirmed by Evolet Salminen  MD, Danyell Shader  ([id number]) on 01/21/2016 5:59:36 AM      MDM   Final diagnoses:  Vasovagal syncope  Lower abdominal pain    Patient presents with episode of syncope. She has a history of chronic lower abdominal pain. She had an episode is morning that was similar to prior episodes. She had a syncopal episode in the bathroom. She did have a prodrome of feeling warm. She denied chest pain or shortness breath. Vital signs are reassuring. EKG shows no evidence of arrhythmia. Currently she states that she is fairly comfortable regarding her abdominal pain.  Orthostatics are negative. Basic labwork shows no evidence of anemia and CMP and lipase are reassuring. Patient was tested for STDs. Discussed with the patient that I felt her syncope was likely related to vasovagal episode. Regarding her chronic abdominal pain, would have her follow up with her GYN for further management if pain persists. No indication for imaging at this time.  After history, exam, and medical workup I feel the patient has been appropriately medically screened and is safe for discharge home. Pertinent diagnoses were discussed with the patient. Patient was given return precautions.   Shon Baton, MD 01/21/16 [phone]

## 2016-01-21 NOTE — Discharge Instructions (Signed)

## 2016-01-22 LAB — GC/CHLAMYDIA PROBE AMP (~~LOC~~) NOT AT ARMC
CHLAMYDIA, DNA PROBE: NEGATIVE
NEISSERIA GONORRHEA: NEGATIVE

## 2016-04-25 ENCOUNTER — Emergency Department (HOSPITAL_COMMUNITY)
Admission: EM | Admit: 2016-04-25 | Discharge: 2016-04-25 | Disposition: A | Discharge status: Home / Self Care | Payer: Medicaid Other | Attending: Emergency Medicine | Admitting: Emergency Medicine

## 2016-04-25 ENCOUNTER — Encounter (HOSPITAL_COMMUNITY): Payer: Self-pay | Admitting: Emergency Medicine

## 2016-04-25 ENCOUNTER — Emergency Department (HOSPITAL_COMMUNITY): Payer: Medicaid Other

## 2016-04-25 DIAGNOSIS — M62838 Other muscle spasm: Secondary | ICD-10-CM | POA: Diagnosis not present

## 2016-04-25 DIAGNOSIS — F17210 Nicotine dependence, cigarettes, uncomplicated: Secondary | ICD-10-CM | POA: Insufficient documentation

## 2016-04-25 DIAGNOSIS — R51 Headache: Secondary | ICD-10-CM | POA: Insufficient documentation

## 2016-04-25 DIAGNOSIS — Z79899 Other long term (current) drug therapy: Secondary | ICD-10-CM | POA: Diagnosis not present

## 2016-04-25 DIAGNOSIS — R519 Headache, unspecified: Secondary | ICD-10-CM

## 2016-04-25 DIAGNOSIS — R011 Cardiac murmur, unspecified: Secondary | ICD-10-CM | POA: Diagnosis not present

## 2016-04-25 DIAGNOSIS — F419 Anxiety disorder, unspecified: Secondary | ICD-10-CM | POA: Diagnosis not present

## 2016-04-25 DIAGNOSIS — Z3202 Encounter for pregnancy test, result negative: Secondary | ICD-10-CM | POA: Insufficient documentation

## 2016-04-25 DIAGNOSIS — Z793 Long term (current) use of hormonal contraceptives: Secondary | ICD-10-CM | POA: Insufficient documentation

## 2016-04-25 LAB — CBC
HEMATOCRIT: 36 % (ref 36.0–46.0)
Hemoglobin: 12 g/dL (ref 12.0–15.0)
MCH: 28.7 pg (ref 26.0–34.0)
MCHC: 33.3 g/dL (ref 30.0–36.0)
MCV: 86.1 fL (ref 78.0–100.0)
PLATELETS: 268 10*3/uL (ref 150–400)
RBC: 4.18 MIL/uL (ref 3.87–5.11)
RDW: 13 % (ref 11.5–15.5)
WBC: 8.1 10*3/uL (ref 4.0–10.5)

## 2016-04-25 LAB — COMPREHENSIVE METABOLIC PANEL
ALBUMIN: 3.6 g/dL (ref 3.5–5.0)
ALT: 11 U/L — AB (ref 14–54)
AST: 14 U/L — AB (ref 15–41)
Alkaline Phosphatase: 32 U/L — ABNORMAL LOW (ref 38–126)
Anion gap: 3 — ABNORMAL LOW (ref 5–15)
BILIRUBIN TOTAL: 1.1 mg/dL (ref 0.3–1.2)
BUN: 10 mg/dL (ref 6–20)
CHLORIDE: 111 mmol/L (ref 101–111)
CO2: 25 mmol/L (ref 22–32)
Calcium: 8.6 mg/dL — ABNORMAL LOW (ref 8.9–10.3)
Creatinine, Ser: 1.06 mg/dL — ABNORMAL HIGH (ref 0.44–1.00)
GFR calc Af Amer: >60 mL/min (ref >60)
GFR calc non Af Amer: >60 mL/min (ref >60)
GLUCOSE: 85 mg/dL (ref 65–99)
POTASSIUM: 3.8 mmol/L (ref 3.5–5.1)
Sodium: 139 mmol/L (ref 135–145)
Total Protein: 6.2 g/dL — ABNORMAL LOW (ref 6.5–8.1)

## 2016-04-25 LAB — RAPID URINE DRUG SCREEN, HOSP PERFORMED
AMPHETAMINES: NOT DETECTED
BARBITURATES: NOT DETECTED
BENZODIAZEPINES: NOT DETECTED
Cocaine: NOT DETECTED
Opiates: NOT DETECTED
TETRAHYDROCANNABINOL: NOT DETECTED

## 2016-04-25 LAB — URINALYSIS, ROUTINE W REFLEX MICROSCOPIC
BILIRUBIN URINE: NEGATIVE
GLUCOSE, UA: NEGATIVE mg/dL
HGB URINE DIPSTICK: NEGATIVE
Ketones, ur: NEGATIVE mg/dL
Nitrite: NEGATIVE
Protein, ur: 30 mg/dL — AB
SPECIFIC GRAVITY, URINE: 1.014 (ref 1.005–1.030)
pH: 5.5 (ref 5.0–8.0)

## 2016-04-25 LAB — URINE MICROSCOPIC-ADD ON

## 2016-04-25 LAB — PREGNANCY, URINE: PREG TEST UR: NEGATIVE

## 2016-04-25 MED ORDER — LORAZEPAM 1 MG PO TABS
1.0000 mg | ORAL_TABLET | Freq: Once | ORAL | Status: AC
  Administered 2016-04-25: 1 mg via ORAL
  Filled 2016-04-25: qty 1

## 2016-04-25 MED ORDER — HYDROXYZINE HCL 25 MG PO TABS: 25.0000 mg | ORAL_TABLET | Freq: Four times a day (QID) | ORAL | Status: DC | PRN

## 2016-04-25 MED ORDER — IBUPROFEN 400 MG PO TABS
400.0000 mg | ORAL_TABLET | Freq: Once | ORAL | Status: AC
  Administered 2016-04-25: 400 mg via ORAL
  Filled 2016-04-25: qty 1

## 2016-04-25 NOTE — ED Notes (Signed)
Patient transported to CT 

## 2016-04-25 NOTE — ED Provider Notes (Signed)
CSN: [id number]     Arrival date & time 04/25/16  0846 History   First MD Initiated Contact with Patient 04/25/16 [phone]     Chief Complaint  Patient presents with  . Migraine  . Spasms  . Nausea     (Consider location/radiation/quality/duration/timing/severity/associated sxs/prior Treatment) Patient is a 30 y.o. female presenting with migraines. The history is provided by the patient.  Migraine Associated symptoms include headaches. Pertinent negatives include no chest pain, no abdominal pain and no shortness of breath.  Patient presents c/o headaches for the past 2 weeks, diffuse, dull, at times throbbing.  Headaches gradual onset, occuring several times per day.  States occasional headaches in past but never this persistent/frequent.  Also notes occasional diffuse muscle spasm, which is like a twitch of muscles, lasts 1-2 seconds per episode. Nausea. No vomiting.  States [facility]cp [facility]  Pt/signif other complain no labs or imaging done.  Patient also notes feeling anxious at times.  Boyfriend feels patient may have undiagnosed anxiety/depression issues as well.       Past Medical History  Diagnosis Date  . Heart murmur   . Abdominal pain    Past Surgical History  Procedure Laterality Date  . Tonsillectomy    . Addenoidectomy     No family history on file. Social History  Substance Use Topics  . Smoking status: Current Some Day Smoker -- 1.00 packs/day    Types: Cigarettes  . Smokeless tobacco: None  . Alcohol Use: Yes   OB History    No data available     Review of Systems  Constitutional: Negative for fever and chills.  HENT: Negative for sore throat.   Eyes: Negative for visual disturbance.  Respiratory: Negative for shortness of breath.   Cardiovascular: Negative for chest pain.  Gastrointestinal: Negative for vomiting, abdominal pain and diarrhea.  Genitourinary: Negative for dysuria.  Musculoskeletal: Negative for back pain and neck  pain.  Skin: Negative for rash.  Neurological: Positive for headaches. Negative for weakness and numbness.  Hematological: Does not bruise/bleed easily.  Psychiatric/Behavioral: Negative for confusion.      Allergies  Icy hot  Home Medications   Prior to Admission medications   Medication Sig Start Date End Date Taking? Authorizing Provider  escitalopram (LEXAPRO) 10 MG tablet Take 10 mg by mouth daily.   Yes Historical Provider, MD  Norethindrone Acetate-Ethinyl Estrad-FE (BLISOVI 24 FE) 1-20 MG-MCG(24) tablet Take 1 tablet by mouth daily.   Yes Historical Provider, MD   BP 105/67 mmHg  Pulse 53  Temp(Src) 98.9 F (37.2 C) (Oral)  Resp 18  Ht 5\' 7"  (1.702 m)  Wt 61.236 kg  BMI 21.14 kg/m2  SpO2 100%  LMP 04/22/2016 Physical Exam  Constitutional: She is oriented to person, place, and time. She appears well-developed and well-nourished. No distress.  HENT:  Head: Atraumatic.  Nose: Nose normal.  Mouth/Throat: Oropharynx is clear and moist.  No sinus or temporal tenderness.  Eyes: Conjunctivae and EOM are normal. Pupils are equal, round, and reactive to light. No scleral icterus.  Neck: Neck supple. No tracheal deviation present. No thyromegaly present.  No stiffness or rigidity.   Cardiovascular: Normal rate, regular rhythm, normal heart sounds and intact distal pulses.  Exam reveals no gallop and no friction rub.   No murmur heard. Pulmonary/Chest: Effort normal and breath sounds normal. No respiratory distress.  Abdominal: Soft. Normal appearance and bowel sounds are normal. She exhibits no distension. There is  no tenderness.  Genitourinary:  No cva tenderness.  Musculoskeletal: Normal range of motion. She exhibits no edema or tenderness.  Neurological: She is alert and oriented to person, place, and time. No cranial nerve deficit.  Motor intact bilaterally. Steady gait.   Skin: Skin is warm and dry. No rash noted. She is not diaphoretic.  Psychiatric:  Anxious.  Flat affect.   Nursing note and vitals reviewed.   ED Course  Procedures (including critical care time) Labs Review   Results for orders placed or performed during the hospital encounter of 04/25/16  CBC  Result Value Ref Range   WBC 8.1 4.0 - 10.5 K/uL   RBC 4.18 3.87 - 5.11 MIL/uL   Hemoglobin 12.0 12.0 - 15.0 g/dL   HCT 16.1 09.6 - 04.5 %   MCV 86.1 78.0 - 100.0 fL   MCH 28.7 26.0 - 34.0 pg   MCHC 33.3 30.0 - 36.0 g/dL   RDW 40.9 81.1 - 91.4 %   Platelets 268 150 - 400 K/uL  Comprehensive metabolic panel  Result Value Ref Range   Sodium 139 135 - 145 mmol/L   Potassium 3.8 3.5 - 5.1 mmol/L   Chloride 111 101 - 111 mmol/L   CO2 25 22 - 32 mmol/L   Glucose, Bld 85 65 - 99 mg/dL   BUN 10 6 - 20 mg/dL   Creatinine, Ser 7.82 (H) 0.44 - 1.00 mg/dL   Calcium 8.6 (L) 8.9 - 10.3 mg/dL   Total Protein 6.2 (L) 6.5 - 8.1 g/dL   Albumin 3.6 3.5 - 5.0 g/dL   AST 14 (L) 15 - 41 U/L   ALT 11 (L) 14 - 54 U/L   Alkaline Phosphatase 32 (L) 38 - 126 U/L   Total Bilirubin 1.1 0.3 - 1.2 mg/dL   GFR calc non Af Amer >60 >60 mL/min   GFR calc Af Amer >60 >60 mL/min   Anion gap 3 (L) 5 - 15  Urinalysis, Routine w reflex microscopic (not at [facility])  Result Value Ref Range   Color, Urine YELLOW YELLOW   APPearance CLOUDY (A) CLEAR   Specific Gravity, Urine 1.014 1.005 - 1.030   pH 5.5 5.0 - 8.0   Glucose, UA NEGATIVE NEGATIVE mg/dL   Hgb urine dipstick NEGATIVE NEGATIVE   Bilirubin Urine NEGATIVE NEGATIVE   Ketones, ur NEGATIVE NEGATIVE mg/dL   Protein, ur 30 (A) NEGATIVE mg/dL   Nitrite NEGATIVE NEGATIVE   Leukocytes, UA TRACE (A) NEGATIVE  Pregnancy, urine  Result Value Ref Range   Preg Test, Ur NEGATIVE NEGATIVE  Urine rapid drug screen (hosp performed)  Result Value Ref Range   Opiates NONE DETECTED NONE DETECTED   Cocaine NONE DETECTED NONE DETECTED   Benzodiazepines NONE DETECTED NONE DETECTED   Amphetamines NONE DETECTED NONE DETECTED   Tetrahydrocannabinol NONE DETECTED  NONE DETECTED   Barbiturates NONE DETECTED NONE DETECTED  Urine microscopic-add on  Result Value Ref Range   Squamous Epithelial / LPF 6-30 (A) NONE SEEN   WBC, UA 6-30 0 - 5 WBC/hpf   RBC / HPF 0-5 0 - 5 RBC/hpf   Bacteria, UA MANY (A) NONE SEEN   Casts HYALINE CASTS (A) NEGATIVE   Ct Head Wo Contrast  04/25/2016  CLINICAL DATA:  Headache, blurred vision, nausea, vomiting, dizziness and weakness for 3 days. No known injury. Initial encounter. EXAM: CT HEAD WITHOUT CONTRAST TECHNIQUE: Contiguous axial images were obtained from the base of the skull through the vertex without intravenous contrast. COMPARISON:  Head CT scan 12/02/2007. FINDINGS: The brain appears normal without hemorrhage, infarct, mass lesion, mass effect, midline shift or abnormal extra-axial fluid collection. No hydrocephalus or pneumocephalus. The calvarium is intact. Imaged paranasal sinuses and mastoid air cells are clear. IMPRESSION: Negative head CT. Electronically Signed   By: Drusilla Kanner M.D.   On: 04/25/2016 10:11      I have personally reviewed and evaluated these images and lab results as part of my medical decision-making.    MDM   Labs. Ct.  Reviewed nursing notes and prior charts for additional history.   Patient appears anxious.  Ativan 1 mg po.  Motrin po.  Discussed ct with pt.    Recheck pt comfortable, no distress.   was recently started on lexapro by pcp - re feeling too soon to know if improvement.  Will provide additional behavioral health resources re outpatient f/u.  Patient with no dysuria, or fever.  ua neg nitrite, +epis - feel not c/w acute uti.      Cathren Laine, MD 04/25/16 [phone]

## 2016-04-25 NOTE — Discharge Instructions (Signed)
It was our pleasure to provide your ER care today - we hope that you feel better.  Your head ct scan looks good/normal.  Rest. Drink plenty of fluids.  It is possible that feelings of anxiety and/or depression may be exacerbating your symptoms.   Continue lexapro.   You may try taking hydroxyzine a need for anxiety - may make drowsy, no driving when taking.  You may also try relaxation exercises - whether yoga, exercise, music, massage (i.e. Whichever modality works best for you).  Follow up with primary care doctor in the coming week.  See resource guide for additional resources.  Return to ER if worse, new symptoms, severe pain, persistent vomiting, fevers, other concern.   You were given medication in the ER - no driving for the next 4 hours.      General Headache Without Cause A headache is pain or discomfort felt around the head or neck area. The specific cause of a headache may not be found. There are many causes and types of headaches. A few common ones are:  Tension headaches.  Migraine headaches.  Cluster headaches.  Chronic daily headaches. HOME CARE INSTRUCTIONS  Watch your condition for any changes. Take these steps to help with your condition: Managing Pain  Take over-the-counter and prescription medicines only as told by your health care provider.  Lie down in a dark, quiet room when you have a headache.  If directed, apply ice to the head and neck area:  Put ice in a plastic bag.  Place a towel between your skin and the bag.  Leave the ice on for 20 minutes, 2-3 times per day.  Use a heating pad or hot shower to apply heat to the head and neck area as told by your health care provider.  Keep lights dim if bright lights bother you or make your headaches worse. Eating and Drinking  Eat meals on a regular schedule.  Limit alcohol use.  Decrease the amount of caffeine you drink, or stop drinking caffeine. General Instructions  Keep all  follow-up visits as told by your health care provider. This is important.  Keep a headache journal to help find out what may trigger your headaches. For example, write down:  What you eat and drink.  How much sleep you get.  Any change to your diet or medicines.  Try massage or other relaxation techniques.  Limit stress.  Sit up straight, and do not tense your muscles.  Do not use tobacco products, including cigarettes, chewing tobacco, or e-cigarettes. If you need help quitting, ask your health care provider.  Exercise regularly as told by your health care provider.  Sleep on a regular schedule. Get 7-9 hours of sleep, or the amount recommended by your health care provider. SEEK MEDICAL CARE IF:   Your symptoms are not helped by medicine.  You have a headache that is different from the usual headache.  You have nausea or you vomit.  You have a fever. SEEK IMMEDIATE MEDICAL CARE IF:   Your headache becomes severe.  You have repeated vomiting.  You have a stiff neck.  You have a loss of vision.  You have problems with speech.  You have pain in the eye or ear.  You have muscular weakness or loss of muscle control.  You lose your balance or have trouble walking.  You feel faint or pass out.  You have confusion.   This information is not intended to replace advice given to you by  your health care provider. Make sure you discuss any questions you have with your health care provider.   Document Released: 11/07/2005 Document Revised: 07/29/2015 Document Reviewed: 03/02/2015 Elsevier Interactive Patient Education 2016 [place] and Stress Management Stress is a normal reaction to life events. It is what you feel when life demands more than you are used to or more than you can handle. Some stress can be useful. For example, the stress reaction can help you catch the last bus of the day, study for a test, or meet a deadline at work. But stress that  occurs too often or for too long can cause problems. It can affect your emotional health and interfere with relationships and normal daily activities. Too much stress can weaken your immune system and increase your risk for physical illness. If you already have a medical problem, stress can make it worse. CAUSES  All sorts of life events may cause stress. An event that causes stress for one person may not be stressful for another person. Major life events commonly cause stress. These may be positive or negative. Examples include losing your job, moving into a new home, getting married, having a baby, or losing a loved one. Less obvious life events may also cause stress, especially if they occur day after day or in combination. Examples include working long hours, driving in traffic, caring for children, being in debt, or being in a difficult relationship. SIGNS AND SYMPTOMS Stress may cause emotional symptoms including, the following:  Anxiety. This is feeling worried, afraid, on edge, overwhelmed, or out of control.  Anger. This is feeling irritated or impatient.  Depression. This is feeling sad, down, helpless, or guilty.  Difficulty focusing, remembering, or making decisions. Stress may cause physical symptoms, including the following:   Aches and pains. These may affect your head, neck, back, stomach, or other areas of your body.  Tight muscles or clenched jaw.  Low energy or trouble sleeping. Stress may cause unhealthy behaviors, including the following:   Eating to feel better (overeating) or skipping meals.  Sleeping too little, too much, or both.  Working too much or putting off tasks (procrastination).  Smoking, drinking alcohol, or using drugs to feel better. DIAGNOSIS  Stress is diagnosed through an assessment by your health care provider. Your health care provider will ask questions about your symptoms and any stressful life events.Your health care provider will also ask  about your medical history and may order blood tests or other tests. Certain medical conditions and medicine can cause physical symptoms similar to stress. Mental illness can cause emotional symptoms and unhealthy behaviors similar to stress. Your health care provider may refer you to a mental health professional for further evaluation.  TREATMENT  Stress management is the recommended treatment for stress.The goals of stress management are reducing stressful life events and coping with stress in healthy ways.  Techniques for reducing stressful life events include the following:  Stress identification. Self-monitor for stress and identify what causes stress for you. These skills may help you to avoid some stressful events.  Time management. Set your priorities, keep a calendar of events, and learn to say "no." These tools can help you avoid making too many commitments. Techniques for coping with stress include the following:  Rethinking the problem. Try to think realistically about stressful events rather than ignoring them or overreacting. Try to find the positives in a stressful situation rather than focusing on the negatives.  Exercise. Physical exercise can  release both physical and emotional tension. The key is to find a form of exercise you enjoy and do it regularly.  Relaxation techniques. These relax the body and mind. Examples include yoga, meditation, tai chi, biofeedback, deep breathing, progressive muscle relaxation, listening to music, being out in nature, journaling, and other hobbies. Again, the key is to find one or more that you enjoy and can do regularly.  Healthy lifestyle. Eat a balanced diet, get plenty of sleep, and do not smoke. Avoid using alcohol or drugs to relax.  Strong support network. Spend time with family, friends, or other people you enjoy being around.Express your feelings and talk things over with someone you trust. Counseling or talktherapy with a mental  health professional may be helpful if you are having difficulty managing stress on your own. Medicine is typically not recommended for the treatment of stress.Talk to your health care provider if you think you need medicine for symptoms of stress. HOME CARE INSTRUCTIONS  Keep all follow-up visits as directed by your health care provider.  Take all medicines as directed by your health care provider. SEEK MEDICAL CARE IF:  Your symptoms get worse or you start having new symptoms.  You feel overwhelmed by your problems and can no longer manage them on your own. SEEK IMMEDIATE MEDICAL CARE IF:  You feel like hurting yourself or someone else.   This information is not intended to replace advice given to you by your health care provider. Make sure you discuss any questions you have with your health care provider.   Document Released: 05/03/2001 Document Revised: 11/28/2014 Document Reviewed: 07/02/2013 Elsevier Interactive Patient Education 2016 Elsevier Inc.   Generalized Anxiety Disorder Generalized anxiety disorder (GAD) is a mental disorder. It interferes with life functions, including relationships, work, and school. GAD is different from normal anxiety, which everyone experiences at some point in their lives in response to specific life events and activities. Normal anxiety actually helps [place] prepare for and get through these life events and activities. Normal anxiety goes away after the event or activity is over.  GAD causes anxiety that is not necessarily related to specific events or activities. It also causes excess anxiety in proportion to specific events or activities. The anxiety associated with GAD is also difficult to control. GAD can vary from mild to severe. People with severe GAD can have intense waves of anxiety with physical symptoms (panic attacks).  SYMPTOMS The anxiety and worry associated with GAD are difficult to control. This anxiety and worry are related to many life  events and activities and also occur more days than not for 6 months or longer. People with GAD also have three or more of the following symptoms (one or more in children):  Restlessness.   Fatigue.  Difficulty concentrating.   Irritability.  Muscle tension.  Difficulty sleeping or unsatisfying sleep. DIAGNOSIS GAD is diagnosed through an assessment by your health care provider. Your health care provider will ask you questions aboutyour mood,physical symptoms, and events in your life. Your health care provider may ask you about your medical history and use of alcohol or drugs, including prescription medicines. Your health care provider may also do a physical exam and blood tests. Certain medical conditions and the use of certain substances can cause symptoms similar to those associated with GAD. Your health care provider may refer you to a mental health specialist for further evaluation. TREATMENT The following therapies are usually used to treat GAD:   Medication. Antidepressant medication usually is  prescribed for long-term daily control. Antianxiety medicines may be added in severe cases, especially when panic attacks occur.   Talk therapy (psychotherapy). Certain types of talk therapy can be helpful in treating GAD by providing support, education, and guidance. A form of talk therapy called cognitive behavioral therapy can teach you healthy ways to think about and react to daily life events and activities.  Stress managementtechniques. These include yoga, meditation, and exercise and can be very helpful when they are practiced regularly. A mental health specialist can help determine which treatment is best for you. Some people see improvement with one therapy. However, other people require a combination of therapies.   This information is not intended to replace advice given to you by your health care provider. Make sure you discuss any questions you have with your health care  provider.   Document Released: 03/04/2013 Document Revised: 11/28/2014 Document Reviewed: 03/04/2013 Elsevier Interactive Patient Education 2016 [facility] The [employer] 211 is a great source of information about community services available.  Access by dialing 2-1-1 from anywhere in [place], or by website -  CustodianSupply.fi.   Other Local Resources (Updated 11/2015)  [facility]  Crisis Hotline, available 24 hours a day, 7 days a week: [place], [place]   [facility]  Crisis Hotline, available 24 hours a day, 7 days a week: [place], [place]  [facility]  Suicide Prevention Hotline, available 24 hours a day, 7 days a week: [place], [place], available 24 hours a day, 7 days a week: [place], [place] Access to [employer], available 24 hours a day, 7 days a week: [phone] All   [facility]  Crisis Hotline, available 24 hours a day, 7 days a week: (475)530-9872 All   Other Local Resources (Updated 11/2015)  Outpatient Counseling  Services     Address and Phone Number  ADS ([facility])   Options include Individual counseling, group counseling, intensive outpatient program (several hours a day, several days a week)  Offers depression assessments  Provides methadone maintenance program [phone] [address] [place], [place] partial hospitalization/day treatment and DUI/DWI programs  [employer], private insurance [phone] [address], [address] [place], [place] [address]  [place] include intensive outpatient program (several hours a day, several days a week), outpatient  treatment, DUI/DWI services, family education  Also has some services specifically for [employer] transitional housing  [phone] [address] [place], [place] [address]     [place] Medicare, private pay, and private insurance [phone] [address] [place], [place] [address]  [facility]  Services include individual counseling, substance abuse intensive outpatient program (several hours a day, several days a week), day treatment  Blinda Leather[place], Medicaid, private insurance [phone] [address], [place], [place] [address]  [facility]   Offers substance abuse intensive outpatient program (several hours a day, several days a week), partial hospitalization program [phone] [address] [place], [place] [address]  [phone] [address] [place], [place] [address]  [phone] [place], [place] [address]  [phone]  Chauncey Cruel, [place], [place] [address]  [facility]  Individual counseling only  Accepts private insurance only [phone] [address], [place], [place] [address]  [facility]: Methadone Clinic  Methadone maintenance program [phone] [address] [place], [place] [address]  [facility] providing substance abuse and mental health counseling  Accepts Medicaid, Medicare, private insurance  Offers sliding scale for uninsured [phone] [place], [place] in [place] individual counseling, and intensive in-home services [phone] [address], [employer] [place], [place] [address]  [facility] individual counseling, family counseling, group therapy, domestic violence counseling, consumer credit counseling  Accepts Medicare, Medicaid, private  insurance  Offers sliding scale for uninsured [phone] [address] [place], [place] [address]  [phone] [facility], [address] [place], [place]  [facility]  Offers individual, family and group counseling  3 locations - [place], [place], and [place]  [place] [address] [place], [place] [address]  [address] [place], [place] [address]  [place], [place] [address]  [facility]    Offers psychiatric assessment, 8-week Intensive Outpatient Program (several hours a day, several times a week, daytime or evenings), early recovery group, family Program, medication management  Private pay or private insurance only [phone], or  [phone] [address] [place], [place] [address]  [facility]  Offers individual, couples and family counseling  Accepts Medicaid, private insurance, and sliding scale for uninsured [phone] [address] [place], [place] [address]  Launa Flight, MD  Individual counseling  Private insurance [phone] [place], [place] [address]  [facility]   Offers assessment, substance abuse treatment, and behavioral health treatment [phone] [address] [place], [place] [address]  [place]  Individual counseling  Accepts private insurance [phone] [place], [place] [address]  [facility]  Individual counseling  Blinda Leatherwood, private insurance [phone] [place], [place] [address]  [facility]    Offers intensive outpatient program (several hours a day, several times a week)  Private pay, private insurance [phone] [place], [place]  Individual counseling  Medicare, private insurance [phone] [address], [place], [place] [address]  [place]    Offers intensive outpatient program (several hours a day, several times a week) and partial hospitalization program [phone] [place], [place] [address]  Letta Moynahan, MD  Individual counseling [phone] [address], [place], [place] [address]  [place] counseling to individuals, couples, and families  Accepts Medicare and private insurance; offers sliding scale for uninsured [phone] [place], [place] [address]  [facility]  Christian counseling [phone] [address], [place], [place] [address]  [facility] [employer] crisis counseling, individual counseling, group therapy, in-home therapy, domestic violence services, day treatment, DWI services, Conservation officer, nature (CST), Assertive Community Treatment Team (ACTT), substance abuse Intensive Outpatient Program (several hours a day, several times a week)  2 locations - [place] and [place], [place] [address]  [phone] [address] [place], [place] [address]  [place] counseling and group therapy  [place] insurance, [place], [place] [phone] [address] [place], [place]  [facility]  Offers individual and family counseling  Offers LGBTQ services  Accepts private insurance and private pay [phone] [place], [place] [address]  [facility]    Offers individual counseling, group therapy, and outpatient detox  Accepts private insurance [phone] [place], [place] Medicare, private insurance [phone] [address] [place], [place] [address]  [employer]  Individual  counseling  Accepts Medicare, private insurance [phone] [address] [place], [place] [address]  [facility] substance abuse Intensive Outpatient Program (several hours a day, several times a week) [phone], or [phone] [place], [place]

## 2016-04-25 NOTE — ED Notes (Signed)
Patient has been having multiple symptoms for 2 weeks.   Patient complains of migraines, muscle spasms, nausea that has worsened in the last few days.   Patient states she has been to her regular doctor once and ED x 2 in the last week.  [facility] and she was to follow up with her doctor today, but "she needed to get here before her appointment".   Boyfriend states that she was exactly the same over the weekend when [facility] was [facility].

## 2017-04-25 ENCOUNTER — Emergency Department (HOSPITAL_COMMUNITY)
Admission: EM | Admit: 2017-04-25 | Discharge: 2017-04-25 | Disposition: A | Discharge status: Home / Self Care | Payer: Medicaid Other | Attending: Emergency Medicine | Admitting: Emergency Medicine

## 2017-04-25 ENCOUNTER — Encounter (HOSPITAL_COMMUNITY): Payer: Self-pay | Admitting: *Deleted

## 2017-04-25 DIAGNOSIS — F17210 Nicotine dependence, cigarettes, uncomplicated: Secondary | ICD-10-CM | POA: Insufficient documentation

## 2017-04-25 DIAGNOSIS — W57XXXA Bitten or stung by nonvenomous insect and other nonvenomous arthropods, initial encounter: Secondary | ICD-10-CM | POA: Insufficient documentation

## 2017-04-25 DIAGNOSIS — Z79899 Other long term (current) drug therapy: Secondary | ICD-10-CM | POA: Diagnosis not present

## 2017-04-25 DIAGNOSIS — Y929 Unspecified place or not applicable: Secondary | ICD-10-CM | POA: Insufficient documentation

## 2017-04-25 DIAGNOSIS — Y999 Unspecified external cause status: Secondary | ICD-10-CM | POA: Diagnosis not present

## 2017-04-25 DIAGNOSIS — Y939 Activity, unspecified: Secondary | ICD-10-CM | POA: Insufficient documentation

## 2017-04-25 DIAGNOSIS — S40862A Insect bite (nonvenomous) of left upper arm, initial encounter: Secondary | ICD-10-CM | POA: Insufficient documentation

## 2017-04-25 DIAGNOSIS — M542 Cervicalgia: Secondary | ICD-10-CM | POA: Diagnosis not present

## 2017-04-25 NOTE — ED Triage Notes (Signed)
Pt states hat she noticed a knot on the back of her neck about 4 weeks. Pt states hat she had a tick on her neck at that time. Pt states that since then she has had neck pain and intermittent headache. Denies headache now. Pt states that her dr was suppose to run blood tests due to the tick but they did not. Pt denies any rash.

## 2017-04-25 NOTE — Discharge Instructions (Signed)
Please read attached information. If you experience any new or worsening signs or symptoms please return to the emergency room for evaluation. Please follow-up with your primary care provider or specialist as discussed.  °

## 2017-04-25 NOTE — ED Notes (Signed)
Pt is in stable condition upon d/c and ambulates from ED. 

## 2017-04-25 NOTE — ED Provider Notes (Signed)
[facility]PT Provider Note   CSN: [id number] Arrival date & time: 04/25/17  1104   By signing my name below, I, Krista Rodriguez, attest that this documentation has been prepared under the direction and in the presence of Burna Forts, PA-C Electronically Signed: Soijett Rodriguez, ED Scribe. 04/25/17. 12:59 PM.  History   Chief Complaint Chief Complaint  Patient presents with  . Neck Injury    HPI  Krista Rodriguez is a 31 y.o. female who presents to the Emergency Department complaining of neck pain starting 4 weeks ago. She reports associated "knot" to posterior neck, nausea, resolved HA, pressure sensation to head, increased fatigue, and tingling to left arm that is worsened with raising her arm. Pt has not tried any medications for the relief of her symptoms. She reports that she found a deer tick to her posterior neck x 4 weeks ago that was removed with tweezers. She states that she reports that the tick was on her less than 24 hours, but is unsure of the exact time. She notes that she was evaluated by her PCP on 5/30 and that her provider wanted to completed a lab work up to rule out tick related issues. She states that her PCP wanted to began a prescription of doxycycline. She denies rash, redness, fever, vomiting, dysuria, and any other symptoms.     The history is provided by the patient and a relative. No language interpreter was used.    Past Medical History:  Diagnosis Date  . Abdominal pain   . Heart murmur     There are no active problems to display for this patient.   Past Surgical History:  Procedure Laterality Date  . addenoidectomy    . TONSILLECTOMY      OB History    No data available       Home Medications    Prior to Admission medications   Medication Sig Start Date End Date Taking? Authorizing Provider  escitalopram (LEXAPRO) 10 MG tablet Take 10 mg by mouth daily.    [provider]  hydrOXYzine (ATARAX/VISTARIL) 25 MG tablet Take 1  tablet (25 mg total) by mouth every 6 (six) hours as needed for anxiety. 04/25/16   Cathren Laine, MD  Norethindrone Acetate-Ethinyl Estrad-FE (BLISOVI 24 FE) 1-20 MG-MCG(24) tablet Take 1 tablet by mouth daily.    [provider]    Family History No family history on file.  Social History Social History  Substance Use Topics  . Smoking status: Current Some Day Smoker    Packs/day: 1.00    Types: Cigarettes  . Smokeless tobacco: Not on file  . Alcohol use Yes     Allergies   Icy hot   Review of Systems Review of Systems  Constitutional: Positive for fatigue. Negative for fever.  Gastrointestinal: Positive for nausea. Negative for vomiting.  Genitourinary: Negative for dysuria.  Skin: Negative for color change and rash.       +"knot" to posterior neck  Neurological: Positive for headaches (resolved).       +tingling sensation to left arm  All other systems reviewed and are negative.    Physical Exam Updated Vital Signs BP (!) 105/59 (BP Location: Left Arm)   Pulse 74   Temp 98.2 F (36.8 C)   Resp 18   LMP 04/16/2017   SpO2 100%   Physical Exam  Constitutional: She is oriented to person, place, and time. She appears well-developed and well-nourished. No distress.  HENT:  Head: Normocephalic and  atraumatic.  Eyes: EOM are normal.  Neck: Neck supple.  Cardiovascular: Normal rate.   Pulmonary/Chest: Effort normal. No respiratory distress.  Abdominal: She exhibits no distension.  Musculoskeletal: Normal range of motion.  No cervical or thoracic spinal tenderness. No obvious deformity of neck. No rash, swelling, or warmth to touch. UE strength 5/5 and sensation intact.   Lymphadenopathy:  Very minor swelling to one posterior auricular lymphnode.  Neurological: She is alert and oriented to person, place, and time.  Skin: Skin is warm and dry.  Psychiatric: She has a normal mood and affect. Her behavior is normal.  Nursing note and vitals  reviewed.    ED Treatments / Results  DIAGNOSTIC STUDIES: Oxygen Saturation is 100% on RA, nl by my interpretation.    COORDINATION OF CARE: 12:56 PM Discussed treatment plan with pt at bedside which includes labs and follow up with PCP and pt agreed to plan.   Labs (all labs ordered are listed, but only abnormal results are displayed) Labs Reviewed  B. BURGDORFI ANTIBODIES    EKG  EKG Interpretation None       Radiology No results found.  Procedures Procedures (including critical care time)  Medications Ordered in ED Medications - No data to display   Initial Impression / Assessment and Plan / ED Course  I have reviewed the triage vital signs and the nursing notes.  Pertinent labs & imaging results that were available during my care of the patient were reviewed by me and considered in my medical decision making (see chart for details).     Labs: B burgdofi antibodies   Imaging:  Consults:  Therapeutics:  Discharge Meds:   Assessment/Plan: 31 year old female presents today with numerous complaint. Patient had take exposure, this was likely less than 24 hours. She notes that it was still alive, non-engorged, with no significant rash thereafter. I have low suspicion for Lyme disease in this patient, she has seen her primary care who would like antibody testing. I find this appropriate this time, labs will be drawn. Patient has infected complaints of a "knot" on the back of her neck. I'm uncertain if this is a very small lymph node or irritation under the dermis. She has no signs of infectious etiology. Patient complaints tingling in her arm that is worse with raising it up overhead, she has no cervical or thoracic spinal tenderness, no pain with compression. She has no weakness or sensory deficits in this extremity. I have very low suspicion for intracranial or neural pathology in this patient at this time. She will follow up as an outpatient with her primary care  provider for ongoing evaluation and management. She is given strict return precautions.   Final Clinical Impressions(s) / ED Diagnoses   Final diagnoses:  Neck pain  Tick bite, initial encounter    New Prescriptions Discharge Medication List as of 04/25/2017  1:25 PM     I personally performed the services described in this documentation, which was scribed in my presence. The recorded information has been reviewed and is accurate.    Eyvonne MechanicHedges, Laresa Oshiro, PA-C 04/25/17 1718    Margarita Grizzleay, Danielle, MD 04/30/17 [phone]

## 2017-04-26 LAB — B. BURGDORFI ANTIBODIES: B burgdorferi Ab IgG+IgM: <0.91 {ISR} (ref 0.00–0.90)

## 2017-06-01 ENCOUNTER — Encounter (HOSPITAL_COMMUNITY): Payer: Self-pay | Admitting: Emergency Medicine

## 2017-06-01 ENCOUNTER — Emergency Department (HOSPITAL_COMMUNITY): Payer: Medicaid Other

## 2017-06-01 ENCOUNTER — Emergency Department (HOSPITAL_COMMUNITY)
Admission: EM | Admit: 2017-06-01 | Discharge: 2017-06-01 | Disposition: A | Discharge status: Home / Self Care | Payer: Medicaid Other | Attending: Emergency Medicine | Admitting: Emergency Medicine

## 2017-06-01 DIAGNOSIS — Z720 Tobacco use: Secondary | ICD-10-CM | POA: Diagnosis not present

## 2017-06-01 DIAGNOSIS — R1033 Periumbilical pain: Secondary | ICD-10-CM | POA: Diagnosis present

## 2017-06-01 DIAGNOSIS — R197 Diarrhea, unspecified: Secondary | ICD-10-CM | POA: Diagnosis not present

## 2017-06-01 DIAGNOSIS — R1084 Generalized abdominal pain: Secondary | ICD-10-CM | POA: Diagnosis not present

## 2017-06-01 DIAGNOSIS — K5900 Constipation, unspecified: Secondary | ICD-10-CM | POA: Diagnosis not present

## 2017-06-01 DIAGNOSIS — Z79899 Other long term (current) drug therapy: Secondary | ICD-10-CM | POA: Diagnosis not present

## 2017-06-01 LAB — COMPREHENSIVE METABOLIC PANEL
ALT: 14 U/L (ref 14–54)
AST: 22 U/L (ref 15–41)
Albumin: 4.1 g/dL (ref 3.5–5.0)
Alkaline Phosphatase: 53 U/L (ref 38–126)
Anion gap: 9 (ref 5–15)
BUN: 9 mg/dL (ref 6–20)
CHLORIDE: 105 mmol/L (ref 101–111)
CO2: 23 mmol/L (ref 22–32)
CREATININE: 0.85 mg/dL (ref 0.44–1.00)
Calcium: 8.9 mg/dL (ref 8.9–10.3)
GFR calc non Af Amer: >60 mL/min (ref >60)
Glucose, Bld: 97 mg/dL (ref 65–99)
POTASSIUM: 4.2 mmol/L (ref 3.5–5.1)
SODIUM: 137 mmol/L (ref 135–145)
Total Bilirubin: 0.6 mg/dL (ref 0.3–1.2)
Total Protein: 6.6 g/dL (ref 6.5–8.1)

## 2017-06-01 LAB — CBC
HEMATOCRIT: 40.8 % (ref 36.0–46.0)
Hemoglobin: 13.7 g/dL (ref 12.0–15.0)
MCH: 30 pg (ref 26.0–34.0)
MCHC: 33.6 g/dL (ref 30.0–36.0)
MCV: 89.5 fL (ref 78.0–100.0)
PLATELETS: 373 10*3/uL (ref 150–400)
RBC: 4.56 MIL/uL (ref 3.87–5.11)
RDW: 13.6 % (ref 11.5–15.5)
WBC: 11.4 10*3/uL — ABNORMAL HIGH (ref 4.0–10.5)

## 2017-06-01 LAB — LIPASE, BLOOD: LIPASE: 24 U/L (ref 11–51)

## 2017-06-01 LAB — URINALYSIS, ROUTINE W REFLEX MICROSCOPIC
Bilirubin Urine: NEGATIVE
GLUCOSE, UA: NEGATIVE mg/dL
HGB URINE DIPSTICK: NEGATIVE
Ketones, ur: NEGATIVE mg/dL
Leukocytes, UA: NEGATIVE
Nitrite: NEGATIVE
PH: 6 (ref 5.0–8.0)
PROTEIN: NEGATIVE mg/dL
Specific Gravity, Urine: 1.012 (ref 1.005–1.030)

## 2017-06-01 LAB — I-STAT BETA HCG BLOOD, ED (MC, WL, AP ONLY): I-stat hCG, quantitative: <5 m[IU]/mL (ref <5)

## 2017-06-01 MED ORDER — SODIUM CHLORIDE 0.9 % IV BOLUS (SEPSIS)
1000.0000 mL | Freq: Once | INTRAVENOUS | Status: AC
  Administered 2017-06-01: 1000 mL via INTRAVENOUS

## 2017-06-01 MED ORDER — DOCUSATE SODIUM 100 MG PO CAPS: 100.0000 mg | ORAL_CAPSULE | Freq: Two times a day (BID) | ORAL | 0 refills | Status: DC

## 2017-06-01 MED ORDER — PROMETHAZINE HCL 25 MG PO TABS: 25.0000 mg | ORAL_TABLET | Freq: Four times a day (QID) | ORAL | 0 refills | Status: DC | PRN

## 2017-06-01 MED ORDER — KETOROLAC TROMETHAMINE 30 MG/ML IJ SOLN
30.0000 mg | Freq: Once | INTRAMUSCULAR | Status: AC
  Administered 2017-06-01: 30 mg via INTRAVENOUS
  Filled 2017-06-01: qty 1

## 2017-06-01 MED ORDER — ONDANSETRON HCL 4 MG/2ML IJ SOLN
4.0000 mg | Freq: Once | INTRAMUSCULAR | Status: AC
  Administered 2017-06-01: 4 mg via INTRAVENOUS
  Filled 2017-06-01: qty 2

## 2017-06-01 MED ORDER — PROMETHAZINE HCL 25 MG/ML IJ SOLN
25.0000 mg | Freq: Once | INTRAMUSCULAR | Status: AC
  Administered 2017-06-01: 25 mg via INTRAVENOUS
  Filled 2017-06-01: qty 1

## 2017-06-01 MED ORDER — POLYETHYLENE GLYCOL 3350 17 G PO PACK: 17.0000 g | PACK | Freq: Every day | ORAL | 0 refills | Status: DC

## 2017-06-01 NOTE — ED Provider Notes (Signed)
1 [facility] Provider Note  CSN: [id number] Arrival date & time: 06/01/17  0310     History   Chief Complaint Chief Complaint  Patient presents with  . Abdominal Pain  . Diarrhea    HPI Krista Rodriguez is a 31 y.o. female.  HPI  31 year old female presents with periumbilical abdominal pain that spreads through her entire abdomen starting around 11 PM. She has been having nausea without vomiting as well as 2 episodes of diarrhea. No blood in her stool. Is passing gas but not increased per her. She had some vaginal spotting last week, but this has resolved. Otherwise no miss menstrual cycles. No urinary symptoms. The pain also goes to her back. Did not check her temperature but felt hot and cold. Has not taken anything for the pain. 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   Past Medical History:  Diagnosis Date  . Abdominal pain   . Heart murmur     There are no active problems to display for this patient.   Past Surgical History:  Procedure Laterality Date  . addenoidectomy    . TONSILLECTOMY      OB History    No data available       Home Medications    Prior to Admission medications   Medication Sig Start Date End Date Taking? Authorizing Provider  Norethindrone Acetate-Ethinyl Estrad-FE (BLISOVI 24 FE) 1-20 MG-MCG(24) tablet Take 1 tablet by mouth daily.   Yes [provider]  docusate sodium (COLACE) 100 MG capsule Take 1 capsule (100 mg total) by mouth every 12 (twelve) hours. 06/01/17   Pricilla Loveless, MD  hydrOXYzine (ATARAX/VISTARIL) 25 MG tablet Take 1 tablet (25 mg total) by mouth every 6 (six) hours as needed for anxiety. Patient not taking: Reported on 06/01/2017 04/25/16   Cathren Laine, MD  polyethylene glycol [facility] Ethelene Hal) packet Take 17 g by mouth daily. 06/01/17   Pricilla Loveless, MD  promethazine (PHENERGAN) 25 MG tablet Take 1 tablet  (25 mg total) by mouth every 6 (six) hours as needed for nausea or vomiting. 06/01/17   Pricilla Loveless, MD    Family History No family history on file.  Social History Social History  Substance Use Topics  . Smoking status: Current Some Day Smoker    Packs/day: 1.00    Types: Cigarettes  . Smokeless tobacco: Not on file  . Alcohol use Yes     Allergies   Icy hot   Review of Systems Review of Systems  Constitutional: Positive for fever (subjective).  Gastrointestinal: Positive for abdominal pain, diarrhea and nausea. Negative for blood in stool, constipation and vomiting.  Genitourinary: Negative for dysuria, menstrual problem and vaginal bleeding.  Musculoskeletal: Positive for back pain.  All other systems reviewed and are negative.    Physical Exam Updated Vital Signs BP 117/71   Pulse 73   Temp 98.9 F (37.2 C) (Oral)   Resp 17   Ht 5\' 7"  (1.702 m)   Wt 60.8 kg (134 lb)   LMP 05/23/2017 (Approximate) Comment: neg preg test  SpO2 100%   BMI 20.99 kg/m   Physical Exam  Constitutional: She is oriented to person, place, and time. She appears well-developed and well-nourished.  HENT:  Head: Normocephalic and atraumatic.  Right Ear: External ear normal.  Left Ear: External ear normal.  Nose: Nose normal.  Eyes: Right eye exhibits no discharge. Left eye  exhibits no discharge.  Cardiovascular: Normal rate, regular rhythm and normal heart sounds.   Pulmonary/Chest: Effort normal and breath sounds normal.  Abdominal: Soft. She exhibits no distension. There is tenderness. There is no CVA tenderness.  Soft. Diffuse tenderness, worst in diffuse lower abdomen  Neurological: She is alert and oriented to person, place, and time.  Skin: Skin is warm and dry. She is not diaphoretic.  Nursing note and vitals reviewed.    ED Treatments / Results  Labs (all labs ordered are listed, but only abnormal results are displayed) Labs Reviewed  CBC - Abnormal; Notable for the  following:       Result Value   WBC 11.4 (*)    All other components within normal limits  LIPASE, BLOOD  COMPREHENSIVE METABOLIC PANEL  URINALYSIS, ROUTINE W REFLEX MICROSCOPIC  I-STAT BETA HCG BLOOD, ED ([facility], [facility], [facility] ONLY)    EKG  EKG Interpretation None       Radiology Dg Abd 2 Views  Result Date: 06/01/2017 CLINICAL DATA:  Initial evaluation for acute abdominal pain, nausea. EXAM: ABDOMEN - 2 VIEW COMPARISON:  None. FINDINGS: Bowel gas pattern within normal limits without evidence for obstruction or ileus. No abnormal bowel wall thickening. No free air. Large stool burden, suggesting constipation. No soft tissue mass or abnormal calcification. Visualized lungs are clear. Visualized osseous structures within normal limits. IMPRESSION: 1. Nonobstructive bowel gas pattern. 2. Large stool burden, suggesting constipation. Electronically Signed   By: Rise Mu M.D.   On: 06/01/2017 05:45    Procedures Procedures (including critical care time)  Medications Ordered in ED Medications  ketorolac (TORADOL) 30 MG/ML injection 30 mg (30 mg Intravenous Given 06/01/17 0521)  ondansetron (ZOFRAN) injection 4 mg (4 mg Intravenous Given 06/01/17 0521)  sodium chloride 0.9 % bolus 1,000 mL (0 mLs Intravenous Stopped 06/01/17 0608)  promethazine (PHENERGAN) injection 25 mg (25 mg Intravenous Given 06/01/17 0604)     Initial Impression / Assessment and Plan / ED Course  I have reviewed the triage vital signs and the nursing notes.  Pertinent labs & imaging results that were available during my care of the patient were reviewed by me and considered in my medical decision making (see chart for details).     Patient is feeling much better. There is no focal tenderness. Her x-ray shows large stool burden. On further questioned she states she is chronically constipated and usually has harder stools. It's possible this is related to stool buildup. There is no obstruction. Repeat exam shows  significantly improved and only mild diffuse tenderness. I discussed options with patient, in this acute pain over just a few hours I think something like appendicitis is less likely. No lateralizing symptoms to suggest an ovarian pathology. No GU symptoms. I don't think CT would be very helpful. I discussed potential misdiagnoses such as appendicitis or intra-abdominal emergency and at this time patient wants to wait on CT and try treat meds for constipation. She will be given nausea control. Discussed strict return precautions and need for PCP follow-up in the next 12-24 hours if symptoms are not improving, earlier if worsening.  Final Clinical Impressions(s) / ED Diagnoses   Final diagnoses:  Generalized abdominal pain  Constipation, unspecified constipation type    New Prescriptions New Prescriptions   DOCUSATE SODIUM (COLACE) 100 MG CAPSULE    Take 1 capsule (100 mg total) by mouth every 12 (twelve) hours.   POLYETHYLENE GLYCOL (MIRALAX / GLYCOLAX) PACKET    Take 17 g by  mouth daily.   PROMETHAZINE (PHENERGAN) 25 MG TABLET    Take 1 tablet (25 mg total) by mouth every 6 (six) hours as needed for nausea or vomiting.     Pricilla LovelessGoldston, Leavy Heatherly, MD 06/01/17 [phone]

## 2017-06-01 NOTE — ED Triage Notes (Addendum)
Pt reports that she began to have abdominal pain three hours ago w/ diarrhea.   While she is nauseas she has not thrown up.  Pt denies dizzy, sob.  Pt reports "a couple drinks" last night but "nothing new."

## 2017-06-01 NOTE — Discharge Instructions (Signed)
If your abdominal pain does not improve in the next 12-24 hours, see your Primary Care Doctor for re-evaluation. If your pain worsens or you develop vomiting, fevers, or other concerning symptoms, return to the ER for evaluation.

## 2017-06-04 ENCOUNTER — Encounter (HOSPITAL_COMMUNITY): Payer: Self-pay

## 2017-06-04 ENCOUNTER — Emergency Department (HOSPITAL_COMMUNITY)
Admission: EM | Admit: 2017-06-04 | Discharge: 2017-06-04 | Disposition: A | Discharge status: Home / Self Care | Payer: Medicaid Other | Attending: Emergency Medicine | Admitting: Emergency Medicine

## 2017-06-04 ENCOUNTER — Emergency Department (HOSPITAL_COMMUNITY): Payer: Medicaid Other

## 2017-06-04 DIAGNOSIS — R011 Cardiac murmur, unspecified: Secondary | ICD-10-CM | POA: Insufficient documentation

## 2017-06-04 DIAGNOSIS — F17210 Nicotine dependence, cigarettes, uncomplicated: Secondary | ICD-10-CM | POA: Insufficient documentation

## 2017-06-04 DIAGNOSIS — R1013 Epigastric pain: Secondary | ICD-10-CM

## 2017-06-04 DIAGNOSIS — N390 Urinary tract infection, site not specified: Secondary | ICD-10-CM | POA: Insufficient documentation

## 2017-06-04 LAB — COMPREHENSIVE METABOLIC PANEL
ALT: 15 U/L (ref 14–54)
ANION GAP: 9 (ref 5–15)
AST: 17 U/L (ref 15–41)
Albumin: 4.3 g/dL (ref 3.5–5.0)
Alkaline Phosphatase: 47 U/L (ref 38–126)
BUN: 7 mg/dL (ref 6–20)
CHLORIDE: 103 mmol/L (ref 101–111)
CO2: 22 mmol/L (ref 22–32)
Calcium: 9.3 mg/dL (ref 8.9–10.3)
Creatinine, Ser: 0.74 mg/dL (ref 0.44–1.00)
GFR calc non Af Amer: >60 mL/min (ref >60)
Glucose, Bld: 89 mg/dL (ref 65–99)
Potassium: 3.9 mmol/L (ref 3.5–5.1)
SODIUM: 134 mmol/L — AB (ref 135–145)
Total Bilirubin: 0.8 mg/dL (ref 0.3–1.2)
Total Protein: 7.2 g/dL (ref 6.5–8.1)

## 2017-06-04 LAB — URINALYSIS, ROUTINE W REFLEX MICROSCOPIC
Bilirubin Urine: NEGATIVE
GLUCOSE, UA: NEGATIVE mg/dL
KETONES UR: 5 mg/dL — AB
Nitrite: NEGATIVE
PROTEIN: NEGATIVE mg/dL
Specific Gravity, Urine: 1.013 (ref 1.005–1.030)
pH: 5 (ref 5.0–8.0)

## 2017-06-04 LAB — CBC
HCT: 42.2 % (ref 36.0–46.0)
HEMOGLOBIN: 14.4 g/dL (ref 12.0–15.0)
MCH: 30.4 pg (ref 26.0–34.0)
MCHC: 34.1 g/dL (ref 30.0–36.0)
MCV: 89 fL (ref 78.0–100.0)
Platelets: 390 10*3/uL (ref 150–400)
RBC: 4.74 MIL/uL (ref 3.87–5.11)
RDW: 13.2 % (ref 11.5–15.5)
WBC: 9.8 10*3/uL (ref 4.0–10.5)

## 2017-06-04 LAB — PREGNANCY, URINE: Preg Test, Ur: NEGATIVE

## 2017-06-04 LAB — LIPASE, BLOOD: LIPASE: 19 U/L (ref 11–51)

## 2017-06-04 MED ORDER — SODIUM CHLORIDE 0.9 % IV BOLUS (SEPSIS)
1000.0000 mL | Freq: Once | INTRAVENOUS | Status: AC
  Administered 2017-06-04: 1000 mL via INTRAVENOUS

## 2017-06-04 MED ORDER — CEPHALEXIN 500 MG PO CAPS: 500.0000 mg | ORAL_CAPSULE | Freq: Two times a day (BID) | ORAL | 0 refills | Status: AC

## 2017-06-04 MED ORDER — IOPAMIDOL (ISOVUE-300) INJECTION 61%
100.0000 mL | Freq: Once | INTRAVENOUS | Status: AC | PRN
  Administered 2017-06-04: 100 mL via INTRAVENOUS

## 2017-06-04 NOTE — ED Triage Notes (Signed)
Pt seen in ED 06-01-17 for abd pain, diarrhea.  Pt was prescribed Miralax, colace.  Pain went away until today.  Pt has passed 4 diarrhea stools.  Passing little gas.

## 2017-06-04 NOTE — ED Notes (Signed)
Patient transported to CT 

## 2017-06-04 NOTE — ED Notes (Signed)
ED Provider at bedside. 

## 2017-06-04 NOTE — Discharge Instructions (Signed)
Please read attached information regarding your condition. Take Keflex twice daily for 1 week. Your CT showed mildly enlarged uterus, which could be suggestive of adenomyosis. Please follow-up with your GYN doctor as needed for further imaging. Increase fluid and food intake as tolerated. Return to ED for worsening pain, fevers, increased vomiting, loss of consciousness, blood in stool.

## 2017-06-04 NOTE — ED Notes (Signed)
Pt returned from CT °

## 2017-06-04 NOTE — ED Provider Notes (Signed)
[facility] Provider Note   CSN: [id number] Arrival date & time: 06/04/17  1847     History   Chief Complaint Chief Complaint  Patient presents with  . Abdominal Pain    HPI Krista Rodriguez is a 31 y.o. female.  HPI  Patient presents to ED for epigastric and periumbilical abdominal pain that began 4 days ago. She states that she was seen here in the ED 3 days ago and was taking her MiraLAX and Colace as prescribed. She has had loose stools for the past 3 days. She reports the pain in her abdomen feels like a "squeezing sensation." She states that the pain went away after being discharged from the hospital but returned for the past 7 hours. She reports associated nausea but no vomiting. She is unsure if the symptoms are due to a block of cheese that she ate 5 days ago or a parasite in the [employer] salad that she ate earlier in the week. She denies any urinary symptoms. She reports vaginal bleeding. She states that she sometimes gets her period twice a month due to stress. This would be her second period this month. She denies fevers, chills, hematemesis, blood in stool, prior abdominal surgeries. Patient states that she was told by her OB/GYN that she could possibly have endometriosis.  Past Medical History:  Diagnosis Date  . Abdominal pain   . Heart murmur     There are no active problems to display for this patient.   Past Surgical History:  Procedure Laterality Date  . addenoidectomy    . TONSILLECTOMY      OB History    No data available       Home Medications    Prior to Admission medications   Medication Sig Start Date End Date Taking? Authorizing Provider  cephALEXin (KEFLEX) 500 MG capsule Take 1 capsule (500 mg total) by mouth 2 (two) times daily. 06/04/17 06/11/17  Richell Corker, PA-C  docusate sodium (COLACE) 100 MG capsule Take 1 capsule (100 mg total) by mouth every 12 (twelve) hours. 06/01/17   Pricilla Loveless, MD  hydrOXYzine (ATARAX/VISTARIL)  25 MG tablet Take 1 tablet (25 mg total) by mouth every 6 (six) hours as needed for anxiety. Patient not taking: Reported on 06/01/2017 04/25/16   Cathren Laine, MD  Norethindrone Acetate-Ethinyl Estrad-FE (BLISOVI 24 FE) 1-20 MG-MCG(24) tablet Take 1 tablet by mouth daily.    [provider]  polyethylene glycol (MIRALAX / GLYCOLAX) packet Take 17 g by mouth daily. 06/01/17   Pricilla Loveless, MD  promethazine (PHENERGAN) 25 MG tablet Take 1 tablet (25 mg total) by mouth every 6 (six) hours as needed for nausea or vomiting. 06/01/17   Pricilla Loveless, MD    Family History History reviewed. No pertinent family history.  Social History Social History  Substance Use Topics  . Smoking status: Current Some Day Smoker    Packs/day: 1.00    Types: Cigarettes  . Smokeless tobacco: Never Used  . Alcohol use Yes     Allergies   Icy hot   Review of Systems Review of Systems   Physical Exam Updated Vital Signs BP 110/67   Pulse 69   Temp 98.4 F (36.9 C) (Oral)   Resp 16   LMP 05/23/2017 (Approximate) Comment: neg preg test  SpO2 99%   Physical Exam  Constitutional: She appears well-developed and well-nourished. No distress.  HENT:  Head: Normocephalic and atraumatic.  Nose: Nose normal.  Eyes: Conjunctivae and EOM are normal.  Left eye exhibits no discharge. No scleral icterus.  Neck: Normal range of motion. Neck supple.  Cardiovascular: Normal rate, regular rhythm, normal heart sounds and intact distal pulses.  Exam reveals no gallop and no friction rub.   No murmur heard. Pulmonary/Chest: Effort normal and breath sounds normal. No respiratory distress.  Abdominal: Soft. Bowel sounds are normal. She exhibits no distension. There is tenderness (Epigastric and periumbilical). There is no guarding.  Musculoskeletal: Normal range of motion. She exhibits no edema.  Neurological: She is alert. She exhibits normal muscle tone. Coordination normal.  Skin: Skin is warm and dry. No  rash noted.  Psychiatric: She has a normal mood and affect.  Nursing note and vitals reviewed.    ED Treatments / Results  Labs (all labs ordered are listed, but only abnormal results are displayed) Labs Reviewed  COMPREHENSIVE METABOLIC PANEL - Abnormal; Notable for the following:       Result Value   Sodium 134 (*)    All other components within normal limits  URINALYSIS, ROUTINE W REFLEX MICROSCOPIC - Abnormal; Notable for the following:    APPearance HAZY (*)    Hgb urine dipstick LARGE (*)    Ketones, ur 5 (*)    Leukocytes, UA TRACE (*)    Bacteria, UA RARE (*)    Squamous Epithelial / LPF 0-5 (*)    All other components within normal limits  LIPASE, BLOOD  CBC  PREGNANCY, URINE    EKG  EKG Interpretation None       Radiology Ct Abdomen Pelvis W Contrast  Result Date: 06/04/2017 CLINICAL DATA:  Abdominal pain. EXAM: CT ABDOMEN AND PELVIS WITH CONTRAST TECHNIQUE: Multidetector CT imaging of the abdomen and pelvis was performed using the standard protocol following bolus administration of intravenous contrast. CONTRAST:  ISOVUE-300 IOPAMIDOL (ISOVUE-300) INJECTION 61% COMPARISON:  06/01/2017 abdominal radiographs. 08/21/2014 pelvic sonogram. 09/30/2003 CT abdomen/pelvis. FINDINGS: Lower chest: No significant pulmonary nodules or acute consolidative airspace disease. Hepatobiliary: Normal liver size. Sub 5 mm hypodense lateral segment left liver lobe lesion, too small to characterize, not definitely changed since 07/11/2011, considered benign. No additional liver lesions. Normal gallbladder with no radiopaque cholelithiasis. No biliary ductal dilatation. Pancreas: Normal, with no mass or duct dilation. Spleen: Normal size spleen. Cystic 1.6 cm central splenic lesion with mural calcifications, previously 2.4 cm on 09/30/2003, decreased, considered benign. No new splenic lesions. Adrenals/Urinary Tract: Normal adrenals. Normal kidneys with no hydronephrosis and no renal  mass. Normal bladder. Stomach/Bowel: Grossly normal stomach. Normal caliber small bowel with no small bowel wall thickening. Normal appendix. Normal large bowel with no diverticulosis, large bowel wall thickening or pericolonic fat stranding. Vascular/Lymphatic: Normal caliber abdominal aorta. Patent portal, splenic, hepatic and renal veins. No pathologically enlarged lymph nodes in the abdomen or pelvis. Reproductive: Mildly enlarged globular uterus. Left adnexal 3.5 cm cyst. No additional adnexal lesions. Other: No pneumoperitoneum, ascites or focal fluid collection. Musculoskeletal: No aggressive appearing focal osseous lesions. IMPRESSION: 1. No evidence of bowel obstruction or acute bowel inflammation. Normal appendix. 2. Left adnexal 3.5 cm cyst, for which no further evaluation is required. This recommendation follows ACR consensus guidelines: White Paper of the ACR Incidental Findings Committee II on Adnexal Findings. J Am Coll Radiol 2013:10:675-681. 3. Mildly enlarged globular uterus, nonspecific findings that can be seen with uterine adenomyosis. Pelvic ultrasound correlation could be obtained as clinically warranted. Electronically Signed   By: Delbert Phenix M.D.   On: 06/04/2017 21:08    Procedures Procedures (including critical care  time)  Medications Ordered in ED Medications  sodium chloride 0.9 % bolus 1,000 mL (0 mLs Intravenous Stopped 06/04/17 2043)  iopamidol (ISOVUE-300) 61 % injection 100 mL (100 mLs Intravenous Contrast Given 06/04/17 2047)     Initial Impression / Assessment and Plan / ED Course  I have reviewed the triage vital signs and the nursing notes.  Pertinent labs & imaging results that were available during my care of the patient were reviewed by me and considered in my medical decision making (see chart for details).     Patient presents to the ED for evaluation of right lower quadrant abdominal pain and right flank pain that began 4 days ago. She states that she  is unsure if the symptoms are due to eating a "block of cheese." She also reports associated diarrhea that has worsened since being started on Colace and MiraLAX she was prescribed 2 days ago when she was here in the ED. Her x-ray at that time showed large stool burden consistent with constipation. All labs and urine appeared normal that day. On physical exam today she is tender to palpation in the right lower quadrant. She also has right-sided CVA tenderness present. She has no history of kidney stones in the past or other kidney disease. CT of the abdomen and pelvis was obtained and was negative for acute intra-abdominal abnormality. There was an incidental finding of ovarian cyst and possible adenomyosis. CMP, CBC unremarkable. Lipase negative. Urine pregnancy negative. Urinalysis showed evidence of UTI. Will be sent for culture. Will give patient Keflex to take for UTI and advised to follow-up with PCP if symptoms persist. I also encouraged her to follow-up with her GYN provider for monitoring of persistent adenomyosis. Patient appears stable for discharge at this time. Strict return precautions given.  Final Clinical Impressions(s) / ED Diagnoses   Final diagnoses:  Lower urinary tract infectious disease  Epigastric pain    New Prescriptions Discharge Medication List as of 06/04/2017  9:26 PM    START taking these medications   Details  cephALEXin (KEFLEX) 500 MG capsule Take 1 capsule (500 mg total) by mouth 2 (two) times daily., Starting Sun 06/04/2017, Until Sun 06/11/2017, Print         Idelle LeechKhatri, CiboloH[place], PA-C 06/04/17 2204    Gerhard MunchLockwood, Robert, MD 06/05/17 [phone]

## 2017-06-04 NOTE — ED Notes (Signed)
EMT collected labs per phleb training.

## 2017-08-09 ENCOUNTER — Encounter: Payer: Self-pay | Admitting: Obstetrics and Gynecology

## 2017-08-10 NOTE — Progress Notes (Signed)
Patient did not keep GYN referral appointment for 08/09/2017.  Cornelia Copa MD Attending [facility] for [employer] [facility])

## 2018-04-02 ENCOUNTER — Encounter (HOSPITAL_COMMUNITY): Payer: Self-pay

## 2018-04-02 ENCOUNTER — Emergency Department (HOSPITAL_COMMUNITY)
Admission: EM | Admit: 2018-04-02 | Discharge: 2018-04-03 | Disposition: A | Discharge status: Home / Self Care | Payer: Medicaid Other | Attending: Emergency Medicine | Admitting: Emergency Medicine

## 2018-04-02 DIAGNOSIS — R55 Syncope and collapse: Secondary | ICD-10-CM | POA: Insufficient documentation

## 2018-04-02 DIAGNOSIS — Z79899 Other long term (current) drug therapy: Secondary | ICD-10-CM | POA: Insufficient documentation

## 2018-04-02 DIAGNOSIS — K529 Noninfective gastroenteritis and colitis, unspecified: Secondary | ICD-10-CM | POA: Insufficient documentation

## 2018-04-02 DIAGNOSIS — F17210 Nicotine dependence, cigarettes, uncomplicated: Secondary | ICD-10-CM | POA: Insufficient documentation

## 2018-04-02 LAB — CBG MONITORING, ED: GLUCOSE-CAPILLARY: 110 mg/dL — AB (ref 65–99)

## 2018-04-02 NOTE — ED Triage Notes (Signed)
Pt states that she has syncopal episode this afternoon along with n/v/d and lower abd pain. Diarrhea all day with one bloody stool, vomited x 1.

## 2018-04-03 LAB — URINALYSIS, ROUTINE W REFLEX MICROSCOPIC
BILIRUBIN URINE: NEGATIVE
GLUCOSE, UA: NEGATIVE mg/dL
Hgb urine dipstick: NEGATIVE
KETONES UR: NEGATIVE mg/dL
Leukocytes, UA: NEGATIVE
Nitrite: NEGATIVE
PH: 5 (ref 5.0–8.0)
Protein, ur: NEGATIVE mg/dL
Specific Gravity, Urine: 1.013 (ref 1.005–1.030)

## 2018-04-03 LAB — LIPASE, BLOOD: Lipase: 30 U/L (ref 11–51)

## 2018-04-03 LAB — CBC
HEMATOCRIT: 37.9 % (ref 36.0–46.0)
Hemoglobin: 13 g/dL (ref 12.0–15.0)
MCH: 29.9 pg (ref 26.0–34.0)
MCHC: 34.3 g/dL (ref 30.0–36.0)
MCV: 87.1 fL (ref 78.0–100.0)
Platelets: 284 10*3/uL (ref 150–400)
RBC: 4.35 MIL/uL (ref 3.87–5.11)
RDW: 12.8 % (ref 11.5–15.5)
WBC: 11.1 10*3/uL — AB (ref 4.0–10.5)

## 2018-04-03 LAB — I-STAT BETA HCG BLOOD, ED (MC, WL, AP ONLY)

## 2018-04-03 LAB — BASIC METABOLIC PANEL
Anion gap: 8 (ref 5–15)
BUN: 12 mg/dL (ref 6–20)
CO2: 23 mmol/L (ref 22–32)
CREATININE: 0.86 mg/dL (ref 0.44–1.00)
Calcium: 9.2 mg/dL (ref 8.9–10.3)
Chloride: 107 mmol/L (ref 101–111)
GFR calc non Af Amer: >60 mL/min (ref >60)
GLUCOSE: 111 mg/dL — AB (ref 65–99)
Potassium: 3.7 mmol/L (ref 3.5–5.1)
Sodium: 138 mmol/L (ref 135–145)

## 2018-04-03 NOTE — ED Notes (Signed)
Patient now reports heavy bleeding coming from her rectum when she went to the restroom, states she had an episode of blood in her stools earlier which she reported in triage but now it is straight blood. This RN advised we are working to get her back to a room as soon as possible and to let us know if her symptoms worsen. Pt ambulatory, nad.

## 2018-04-03 NOTE — ED Notes (Signed)
Pt up to nurse first desk asking if her test results were concerning enough for her to stay and be seen, this RN advised patient that I cannot give her test results but that we recommend she stay and be seen by a doctor. Pt advised she would wait a little longer.

## 2018-04-03 NOTE — Discharge Instructions (Addendum)
If you develop worsening abdominal pain or your abdominal pain does not go away or you develop worsening bloody stools, or fever, vomiting, or any other new/concerning symptoms then return to the ER.  Otherwise follow-up with your primary care physician.  Be sure to drink plenty of fluids.

## 2018-04-03 NOTE — ED Provider Notes (Signed)
[facility] EMERGENCY DEPARTMENT Provider Note   CSN: [id number] Arrival date & time: 04/02/18  2153     History   Chief Complaint Chief Complaint  Patient presents with  . Loss of Consciousness    HPI Krista Rodriguez is a 32 y.o. female.  HPI  32 year old female presents with syncope and diarrhea.  The patient states that around 3:30 PM she had [place] food for a late lunch.  At 4 PM at work she stood up and then all of a sudden felt a burning in her lower abdomen and felt lightheaded.  She went to the bathroom because she felt like she had to go have a bowel movement.  She progressively got more lightheaded and diaphoretic.  She  called 911 from the bathroom and shortly after they arrived she passed out for about a second or 2.  Since then she has had multiple diarrheal bowel movements.  The last 2 have been bloody.  She has not had a fever but has had some chills.  She is had on and off lower abdominal cramping, typically around when she is about to have a bowel movement.  She has not had any recent travel or camping.  She vomited once but has had no further nausea or vomiting.  She did not have any headache or chest pain.  Currently she feels better than when she first arrived.  No prior history of bloody bowel movement.  No vaginal bleeding.  She has chronic abdominal pain from endometriosis.  Past Medical History:  Diagnosis Date  . Abdominal pain   . Heart murmur     There are no active problems to display for this patient.   Past Surgical History:  Procedure Laterality Date  . addenoidectomy    . TONSILLECTOMY       OB History   None      Home Medications    Prior to Admission medications   Medication Sig Start Date End Date Taking? Authorizing Provider  docusate sodium (COLACE) 100 MG capsule Take 1 capsule (100 mg total) by mouth every 12 (twelve) hours. 06/01/17   Pricilla Loveless, MD  hydrOXYzine (ATARAX/VISTARIL) 25 MG tablet Take 1 tablet  (25 mg total) by mouth every 6 (six) hours as needed for anxiety. Patient not taking: Reported on 06/01/2017 04/25/16   Cathren Laine, MD  Norethindrone Acetate-Ethinyl Estrad-FE (BLISOVI 24 FE) 1-20 MG-MCG(24) tablet Take 1 tablet by mouth daily.    [provider]  polyethylene glycol (MIRALAX / GLYCOLAX) packet Take 17 g by mouth daily. 06/01/17   Pricilla Loveless, MD  promethazine (PHENERGAN) 25 MG tablet Take 1 tablet (25 mg total) by mouth every 6 (six) hours as needed for nausea or vomiting. 06/01/17   Pricilla Loveless, MD    Family History No family history on file.  Social History Social History   Tobacco Use  . Smoking status: Current Some Day Smoker    Packs/day: 1.00    Types: Cigarettes  . Smokeless tobacco: Never Used  Substance Use Topics  . Alcohol use: Yes  . Drug use: No     Allergies   Icy hot   Review of Systems Review of Systems  Cardiovascular: Negative for chest pain.  Gastrointestinal: Positive for abdominal pain, diarrhea and vomiting.  Neurological: Positive for syncope and light-headedness. Negative for headaches.  All other systems reviewed and are negative.    Physical Exam Updated Vital Signs BP 116/73 (BP Location: Right Arm)   Pulse  84   Temp 99.4 F (37.4 C) (Oral)   Resp 18   Ht  (1.702 m)   Wt 61.2 kg (135 lb)   LMP 03/13/2018   SpO2 98%   BMI 21.14 kg/m   Physical Exam  Constitutional: She is oriented to person, place, and time. She appears well-developed and well-nourished. No distress.  HENT:  Head: Normocephalic and atraumatic.  Right Ear: External ear normal.  Left Ear: External ear normal.  Nose: Nose normal.  Eyes: Right eye exhibits no discharge. Left eye exhibits no discharge.  Cardiovascular: Normal rate, regular rhythm and normal heart sounds.  Pulmonary/Chest: Effort normal and breath sounds normal.  Abdominal: Soft. There is tenderness in the right lower quadrant, suprapubic area and left lower  quadrant.  Genitourinary:  Genitourinary Comments: Patient defers  Neurological: She is alert and oriented to person, place, and time.  CN 3-12 grossly intact. 5/5 strength in all 4 extremities. Normal finger to nose.   Skin: Skin is warm and dry. She is not diaphoretic.  Nursing note and vitals reviewed.    ED Treatments / Results  Labs (all labs ordered are listed, but only abnormal results are displayed) Labs Reviewed  BASIC METABOLIC PANEL - Abnormal; Notable for the following components:      Result Value   Glucose, Bld 111 (*)    All other components within normal limits  CBC - Abnormal; Notable for the following components:   WBC 11.1 (*)    All other components within normal limits  CBG MONITORING, ED - Abnormal; Notable for the following components:   Glucose-Capillary 110 (*)    All other components within normal limits  URINALYSIS, ROUTINE W REFLEX MICROSCOPIC  LIPASE, BLOOD  I-STAT BETA HCG BLOOD, ED ([facility], [facility], [facility] ONLY)    EKG EKG Interpretation  Date/Time:  Monday Apr 02 2018 23:26:37 EDT Ventricular Rate:  78 PR Interval:  180 QRS Duration: 82 QT Interval:  376 QTC Calculation: 428 R Axis:   89 Text Interpretation:  Normal sinus rhythm no acute ST/T changes no significant change since 2017 Confirmed by Pricilla Loveless [phone]) on 04/03/2018 7:17:41 AM   Radiology No results found.  Procedures Procedures (including critical care time)  Medications Ordered in ED Medications - No data to display   Initial Impression / Assessment and Plan / ED Course  I have reviewed the triage vital signs and the nursing notes.  Pertinent labs & imaging results that were available during my care of the patient were reviewed by me and considered in my medical decision making (see chart for details).     Patient is currently well-appearing.  Her ECG and lab work are unremarkable.  Given the all of a sudden abdominal discomfort in the setting of developing acute GI  illness I think her syncope was vasovagal in nature.  Her lab work is reassuring.  Minimal WBC elevation probably from the acute gastroenteritis.  While she does have some lower abdominal tenderness, I think this is from her acute gastroenteritis but do not think she has an acute abdominal emergency.  I do not think she needs an emergent CT scan.  She is not pregnant.  She has been in the waiting room for 9+ hours and has been feeling progressively better.  I offered her IV fluids but she prefers to drink oral fluids.  She defers rectal exam.  She denies rectal pain.  Probably having some blood from the amount of diarrhea she is having.  I  doubt she has an invasive disease that would require antibiotics.  However we did discuss strict return precautions and the need for PCP follow-up.  Final Clinical Impressions(s) / ED Diagnoses   Final diagnoses:  Vasovagal syncope  Acute gastroenteritis    ED Discharge Orders    None       Pricilla Loveless, MD 04/03/18 [phone]

## 2018-04-09 ENCOUNTER — Encounter: Payer: Self-pay | Admitting: Cardiology

## 2018-04-11 DIAGNOSIS — H11153 Pinguecula, bilateral: Secondary | ICD-10-CM | POA: Insufficient documentation

## 2018-05-02 ENCOUNTER — Emergency Department (HOSPITAL_COMMUNITY)
Admission: EM | Admit: 2018-05-02 | Discharge: 2018-05-02 | Disposition: A | Discharge status: Home / Self Care | Payer: Medicaid Other | Attending: Emergency Medicine | Admitting: Emergency Medicine

## 2018-05-02 ENCOUNTER — Encounter (HOSPITAL_COMMUNITY): Payer: Self-pay | Admitting: Emergency Medicine

## 2018-05-02 ENCOUNTER — Emergency Department (HOSPITAL_COMMUNITY): Payer: Medicaid Other

## 2018-05-02 DIAGNOSIS — F17210 Nicotine dependence, cigarettes, uncomplicated: Secondary | ICD-10-CM | POA: Diagnosis not present

## 2018-05-02 DIAGNOSIS — R002 Palpitations: Secondary | ICD-10-CM | POA: Insufficient documentation

## 2018-05-02 DIAGNOSIS — R531 Weakness: Secondary | ICD-10-CM | POA: Diagnosis present

## 2018-05-02 LAB — CBC
HCT: 41.5 % (ref 36.0–46.0)
Hemoglobin: 13.9 g/dL (ref 12.0–15.0)
MCH: 29.8 pg (ref 26.0–34.0)
MCHC: 33.5 g/dL (ref 30.0–36.0)
MCV: 88.9 fL (ref 78.0–100.0)
Platelets: 326 10*3/uL (ref 150–400)
RBC: 4.67 MIL/uL (ref 3.87–5.11)
RDW: 12.6 % (ref 11.5–15.5)
WBC: 6.3 10*3/uL (ref 4.0–10.5)

## 2018-05-02 LAB — URINALYSIS, ROUTINE W REFLEX MICROSCOPIC
Bilirubin Urine: NEGATIVE
Glucose, UA: NEGATIVE mg/dL
Ketones, ur: NEGATIVE mg/dL
Leukocytes, UA: NEGATIVE
Nitrite: NEGATIVE
Protein, ur: NEGATIVE mg/dL
Specific Gravity, Urine: 1.013 (ref 1.005–1.030)
pH: 5 (ref 5.0–8.0)

## 2018-05-02 LAB — RAPID URINE DRUG SCREEN, HOSP PERFORMED
Amphetamines: NOT DETECTED
BENZODIAZEPINES: NOT DETECTED
Barbiturates: NOT DETECTED
Cocaine: NOT DETECTED
OPIATES: NOT DETECTED
TETRAHYDROCANNABINOL: NOT DETECTED

## 2018-05-02 LAB — BASIC METABOLIC PANEL WITH GFR
Anion gap: 6 (ref 5–15)
BUN: 8 mg/dL (ref 6–20)
CO2: 27 mmol/L (ref 22–32)
Calcium: 9.4 mg/dL (ref 8.9–10.3)
Chloride: 106 mmol/L (ref 101–111)
Creatinine, Ser: 0.88 mg/dL (ref 0.44–1.00)
GFR calc Af Amer: >60 mL/min
GFR calc non Af Amer: >60 mL/min
Glucose, Bld: 86 mg/dL (ref 65–99)
Potassium: 4.6 mmol/L (ref 3.5–5.1)
Sodium: 139 mmol/L (ref 135–145)

## 2018-05-02 LAB — CBG MONITORING, ED
Glucose-Capillary: 57 mg/dL — ABNORMAL LOW (ref 65–99)
Glucose-Capillary: 107 mg/dL — ABNORMAL HIGH (ref 65–99)

## 2018-05-02 LAB — I-STAT BETA HCG BLOOD, ED (MC, WL, AP ONLY): I-stat hCG, quantitative: <5 m[IU]/mL

## 2018-05-02 LAB — TSH: TSH: 0.98 u[IU]/mL (ref 0.350–4.500)

## 2018-05-02 NOTE — Discharge Instructions (Addendum)
Return for any problem.  Follow-up with your regular doctor as instructed in 2 to 3 days.

## 2018-05-02 NOTE — ED Notes (Signed)
PT given OJ and graham crackers

## 2018-05-02 NOTE — ED Provider Notes (Signed)
[facility] Provider Note   CSN: [id number] Arrival date & time: 05/02/18  1104     History   Chief Complaint Chief Complaint  Patient presents with  . Weakness    HPI Krista Rodriguez is a 32 y.o. female.  32 year old female with prior history of anxiety, palpitations, and irregular periods presents with complaint of generalized weakness.  Patient reports that around 1 AM she woke from being asleep at home had an episode of feeling weak, sweaty, and with heart racing.  Symptoms lasted approximately 30 minutes.  She denies associated chest pain or shortness of breath.  At this time she feels nearly back to baseline and is only complaining of mild generalized weakness.  She denies associated fever.  She denies associated nausea, vomiting, abdominal pain, focal weakness, or other acute complaint.  She reports that she is currently in the middle of a work-up for her palpitations.  She has a scheduled appointment with cardiology within the next 2 weeks to obtain a Holter monitor.  The history is provided by the patient and medical records.  Weakness  Primary symptoms include dizziness.  Primary symptoms include no focal weakness, no loss of sensation, no speech change, no memory loss, no visual change, no auditory change. This is a new problem. The current episode started 6 to 12 hours ago. The problem has been gradually improving. There was no focality noted. There has been no fever.    Past Medical History:  Diagnosis Date  . Abdominal pain   . Abnormal vaginal bleeding   . Acute sinusitis   . Anxiety   . Blurry vision   . Chest wall pain   . Depression   . Endometriosis   . Galactorrhea   . Gastroenteritis   . Headache   . Heart murmur   . Intermittent palpitations   . Irregular periods   . Irritable bowel syndrome   . Menometrorrhagia   . Myalgia   . Nausea   . Pelvic pain in female   . Pinguecula, bilateral   . Positive Lyme  disease serology   . Rib pain on right side   . Screening for depression   . Sebaceous cyst   . Skin tag   . Sprain of shoulder, right   . Strain of left trapezius muscle   . Syncope   . Tick bite   . Tobacco user   . Toe laceration   . UTI (urinary tract infection)     Patient Active Problem List   Diagnosis Date Noted  . Pinguecula, bilateral     Past Surgical History:  Procedure Laterality Date  . addenoidectomy    . TONSILLECTOMY       OB History   None      Home Medications    Prior to Admission medications   Medication Sig Start Date End Date Taking? Authorizing Provider  docusate sodium (COLACE) 100 MG capsule Take 1 capsule (100 mg total) by mouth every 12 (twelve) hours. Patient not taking: Reported on 05/02/2018 06/01/17   Pricilla LovelessGoldston, Scott, MD  hydrOXYzine (ATARAX/VISTARIL) 25 MG tablet Take 1 tablet (25 mg total) by mouth every 6 (six) hours as needed for anxiety. Patient not taking: Reported on 06/01/2017 04/25/16   Cathren LaineSteinl, Kevin, MD  polyethylene glycol [facility](MIRALAX / Ethelene HalGLYCOLAX) packet Take 17 g by mouth daily. Patient not taking: Reported on 05/02/2018 06/01/17   Pricilla LovelessGoldston, Scott, MD  promethazine (PHENERGAN) 25 MG tablet Take 1 tablet (25 mg  total) by mouth every 6 (six) hours as needed for nausea or vomiting. Patient not taking: Reported on 05/02/2018 06/01/17   Pricilla Loveless, MD    Family History No family history on file.  Social History Social History   Tobacco Use  . Smoking status: Current Some Day Smoker    Packs/day: 1.00    Types: Cigarettes  . Smokeless tobacco: Never Used  Substance Use Topics  . Alcohol use: Yes  . Drug use: No     Allergies   Icy hot   Review of Systems Review of Systems  Cardiovascular: Positive for palpitations.  Neurological: Positive for dizziness and weakness. Negative for speech change and focal weakness.  Psychiatric/Behavioral: Negative for memory loss.  All other systems reviewed and are  negative.    Physical Exam Updated Vital Signs BP (!) 92/58   Pulse 65   Temp 98 F (36.7 C) (Oral)   Resp 20   SpO2 100%   Physical Exam  Constitutional: She is oriented to person, place, and time. She appears well-developed and well-nourished. No distress.  HENT:  Head: Normocephalic and atraumatic.  Mouth/Throat: Oropharynx is clear and moist.  Eyes: Pupils are equal, round, and reactive to light. Conjunctivae and EOM are normal.  Neck: Normal range of motion. Neck supple.  Cardiovascular: Normal rate, regular rhythm and normal heart sounds.  Pulmonary/Chest: Effort normal and breath sounds normal. No respiratory distress.  Abdominal: Soft. She exhibits no distension. There is no tenderness.  Musculoskeletal: Normal range of motion. She exhibits no edema or deformity.  Neurological: She is alert and oriented to person, place, and time.  Alert and oriented x4 Normal speech No facial droop 5 out of 5 strength in all 4 extremities Normal gait  Skin: Skin is warm and dry.  Psychiatric: She has a normal mood and affect.  Nursing note and vitals reviewed.    ED Treatments / Results  Labs (all labs ordered are listed, but only abnormal results are displayed) Labs Reviewed  URINALYSIS, ROUTINE W REFLEX MICROSCOPIC - Abnormal; Notable for the following components:      Result Value   APPearance HAZY (*)    Hgb urine dipstick LARGE (*)    Bacteria, UA FEW (*)    All other components within normal limits  CBG MONITORING, ED - Abnormal; Notable for the following components:   Glucose-Capillary 57 (*)    All other components within normal limits  CBG MONITORING, ED - Abnormal; Notable for the following components:   Glucose-Capillary 107 (*)    All other components within normal limits  BASIC METABOLIC PANEL  CBC  TSH  RAPID URINE DRUG SCREEN, HOSP PERFORMED  I-STAT BETA HCG BLOOD, ED ([facility], [facility], [facility] ONLY)    EKG EKG Interpretation  Date/Time:  Wednesday May 02 2018  11:13:20 EDT Ventricular Rate:  71 PR Interval:  158 QRS Duration: 68 QT Interval:  392 QTC Calculation: 425 R Axis:   82 Text Interpretation:  Normal sinus rhythm Normal ECG Confirmed by Kristine Royal [phone]) on 05/02/2018 3:15:30 PM   Radiology Dg Chest 2 View  Result Date: 05/02/2018 CLINICAL DATA:  Weakness and intermittent dizziness. EXAM: CHEST - 2 VIEW COMPARISON:  Chest x-rays dated 09/24/2017 and 01/08/2007 FINDINGS: The heart size and mediastinal contours are within normal limits. Both lungs are clear. The visualized skeletal structures are unremarkable. IMPRESSION: Normal exam. Electronically Signed   By: Francene Boyers M.D.   On: 05/02/2018 16:06    Procedures Procedures (including critical care  time)  Medications Ordered in ED Medications - No data to display   Initial Impression / Assessment and Plan / ED Course  I have reviewed the triage vital signs and the nursing notes.  Pertinent labs & imaging results that were available during my care of the patient were reviewed by me and considered in my medical decision making (see chart for details).     MDM  Screen complete  Patient is presenting for evaluation of an episode of palpitation and weakness.  Her symptoms have resolved prior to evaluation today.  Screening laboratory evaluations today do not reveal significant acute pathology.  EKG is without signs of acute ischemia.  UA and blood work do not reveal significant acute pathology.  Chest x-ray is without acute disease.  Patient is comfortable at time of discharge.  She understands the need for close follow-up.  Strict return precautions are given and understood.  Final Clinical Impressions(s) / ED Diagnoses   Final diagnoses:  Palpitations    ED Discharge Orders    None       Wynetta Fines, MD 05/02/18 [phone]

## 2018-05-02 NOTE — ED Notes (Signed)
CBG 57- RN notified

## 2018-05-02 NOTE — ED Triage Notes (Signed)
Patient complains of generalized weakness today after waking up in the middle of the night last night feeling like her heart was racing. Patient states she has an appointment with a cardiologist in July because she has been having intermittent syncopal episodes for the last few months. Patient alert, oriented, and in no apparent distress at this time.

## 2018-05-02 NOTE — ED Notes (Signed)
Pt stable, ambulatory, and verbalizes understanding of d/c instructions.  

## 2018-06-07 ENCOUNTER — Ambulatory Visit (INDEPENDENT_AMBULATORY_CARE_PROVIDER_SITE_OTHER): Payer: Medicaid Other | Admitting: Cardiology

## 2018-06-07 ENCOUNTER — Encounter: Payer: Self-pay | Admitting: Cardiology

## 2018-06-07 DIAGNOSIS — R55 Syncope and collapse: Secondary | ICD-10-CM | POA: Insufficient documentation

## 2018-06-07 DIAGNOSIS — R002 Palpitations: Secondary | ICD-10-CM

## 2018-06-07 HISTORY — DX: Syncope and collapse: R55

## 2018-06-07 HISTORY — DX: Palpitations: R00.2

## 2018-06-07 NOTE — Progress Notes (Signed)
Cardiology Office Note    Date:  06/07/2018   ID:  Krista Rodriguez, DOB 1986/10/29, MRN [id number]  PCP:  Ronal Fear, NP  Cardiologist:  Armanda Magic, MD   Chief Complaint  Patient presents with  . New Patient (Initial Visit)    syncope, palpitations    History of Present Illness:  Krista Rodriguez is a 32 y.o. female who is being seen today for the evaluation of palpitations and syncope at the request of Lam, Tawny Asal, NP.   Is a 32 year old female with a history of chronic abdominal pain who presented to the emergency room back in May with syncope.  She apparently had eaten lunch late and had [place] at about a half an hour later stood up and felt a slight burning in her lower abdomen and felt lightheaded.  She went to the bathroom and felt like she had to have a bowel movement and then got lightheaded and diaphoretic.  She called 911 for the bathroom and shortly after they arrived she passed out for a few seconds.  After that she had multiple diarrheal bowel movements and 2 that were bloody.  She was also having abdominal cramping at the time as well.  She also vomited once.  She was taken to the emergency room for evaluation.  Felt that she had acute gastroenteritis possibly from food poisoning and then her syncope was vasovagal in nature.    She tells me she has had several episodes of syncope throughout the years.  Her first occurrence was 4 years ago.  She says it typically they are associated around her menstrual cycle and usually associated with abdominal discomfort.  She is been diagnosed with endometriosis in the past.  She will get the abdominal pain and then start to feel lightheaded and break out in a sweat and have visual changes.  One other episode where she was outside working in the yard and started feeling lightheaded and noticed her heart was racing.  She got weak and so she crawled on the ground over to the shade.  She apparently had a work-up for years ago and a cardiac  monitor was ordered but she did not complete the study because she said she broke the device I did not follow-up.    She then had another a visit to the emergency room in June for complaints of palpitations.  She had woken up around 1 AM because of an episode of feeling weak and broke out in a sweat and noted that her heart was racing.  The symptoms lasted about 30 minutes and then resolved.  There was no associated shortness of breath or chest pain.  Because of continued weakness she presented to the emergency room.  She is now here for further evaluation.   Past Medical History:  Diagnosis Date  . Abdominal pain   . Abnormal vaginal bleeding   . Acute sinusitis   . Anxiety   . Blurry vision   . Chest wall pain   . Depression   . Endometriosis   . Galactorrhea   . Gastroenteritis   . Headache   . Heart murmur   . Intermittent palpitations   . Irregular periods   . Irritable bowel syndrome   . Menometrorrhagia   . Myalgia   . Nausea   . Pelvic pain in female   . Pinguecula, bilateral   . Positive Lyme disease serology   . Rib pain on right side   . Screening  for depression   . Sebaceous cyst   . Skin tag   . Sprain of shoulder, right   . Strain of left trapezius muscle   . Syncope   . Tick bite   . Tobacco user   . Toe laceration   . UTI (urinary tract infection)     Past Surgical History:  Procedure Laterality Date  . addenoidectomy    . TONSILLECTOMY      Current Medications: No outpatient medications have been marked as taking for the 06/07/18 encounter (Office Visit) with Quintella Reichert, MD.    Allergies:   Icy hot   Social History   Socioeconomic History  . Marital status: Single    Spouse name: Not on file  . Number of children: Not on file  . Years of education: Not on file  . Highest education level: Not on file  Occupational History  . Not on file  Social Needs  . Financial resource strain: Not on file  . Food insecurity:    Worry: Not on  file    Inability: Not on file  . Transportation needs:    Medical: Not on file    Non-medical: Not on file  Tobacco Use  . Smoking status: Current Some Day Smoker    Packs/day: 1.00    Types: Cigarettes  . Smokeless tobacco: Never Used  Substance and Sexual Activity  . Alcohol use: Yes  . Drug use: No  . Sexual activity: Never  Lifestyle  . Physical activity:    Days per week: Not on file    Minutes per session: Not on file  . Stress: Not on file  Relationships  . Social connections:    Talks on phone: Not on file    Gets together: Not on file    Attends religious service: Not on file    Active member of club or organization: Not on file    Attends meetings of clubs or organizations: Not on file    Relationship status: Not on file  Other Topics Concern  . Not on file  Social History Narrative  . Not on file     Family History:  The patient's family history is not on file.   ROS:   Please see the history of present illness.    ROS All other systems reviewed and are negative.  No flowsheet data found.     PHYSICAL EXAM:   VS:  BP 108/66 (BP Location: Right Arm, Patient Position: Sitting, Cuff Size: Normal)   Pulse 69   Ht 5\' 7"  (1.702 m)   Wt 132 lb 12.8 oz (60.2 kg)   SpO2 98%   BMI 20.80 kg/m    GEN: Well nourished, well developed, in no acute distress  HEENT: normal  Neck: no JVD, carotid bruits, or masses Cardiac: RRR; no murmurs, rubs, or gallops,no edema.  Intact distal pulses bilaterally.  Respiratory:  clear to auscultation bilaterally, normal work of breathing GI: soft, nontender, nondistended, + BS MS: no deformity or atrophy  Skin: warm and dry, no rash Neuro:  Alert and Oriented x 3, Strength and sensation are intact Psych: euthymic mood, full affect  Wt Readings from Last 3 Encounters:  06/07/18 132 lb 12.8 oz (60.2 kg)  04/02/18 135 lb (61.2 kg)  06/01/17 134 lb (60.8 kg)      Studies/Labs Reviewed:   EKG:  EKG is not ordered today.    Recent Labs: 05/02/2018: BUN 8; Creatinine, Ser 0.88; Hemoglobin 13.9; Platelets 326;  Potassium 4.6; Sodium 139; TSH 0.980   Lipid Panel No results found for: CHOL, TRIG, HDL, CHOLHDL, VLDL, LDLCALC, LDLDIRECT  Additional studies/ records that were reviewed today include:  ER notes from hospital May and June2019    ASSESSMENT:    1. Heart palpitations   2. Vasovagal syncope      PLAN:  In order of problems listed above:  1. Heart palpitations -EKG was normal with no arrhythmias.  I will get an event monitor to evaluate further.  2D echocardiogram done at her PCP office was nonischemic.  2.   Vasovagal syncope - this is to be related to when she is having problems with abdominal pain and around her menstrual cycles.  I have encouraged her to get in to see a GYN since she has a diagnosis of possible endometriosis and continues to have significant abdominal discomfort around her menses.  I will get a 2D echocardiogram to rule out structural heart disease.  She will see my PA back in 6 weeks.  I told her to let me know if she has any further syncopal episodes.      Medication Adjustments/Labs and Tests Ordered: Current medicines are reviewed at length with the patient today.  Concerns regarding medicines are outlined above.  Medication changes, Labs and Tests ordered today are listed in the Patient Instructions below.  There are no Patient Instructions on file for this visit.   Signed, Armanda Magicraci Elyan Vanwieren, MD  06/07/2018 8:59 AM    [facility] [address], [place], [place]  [address] Phone: [phone]; Fax: [phone]

## 2018-06-07 NOTE — Patient Instructions (Signed)
Medication Instructions:  Your physician recommends that you continue on your current medications as directed. Please refer to the Current Medication list given to you today.   Labwork: None ordered  Testing/Procedures: Your physician has requested that you have an echocardiogram. Echocardiography is a painless test that uses sound waves to create images of your heart. It provides your doctor with information about the size and shape of your heart and how well your heart's chambers and valves are working. This procedure takes approximately one hour. There are no restrictions for this procedure.  Your physician has recommended that you wear an event monitor. Event monitors are medical devices that record the heart's electrical activity. Doctors most often us these monitors to diagnose arrhythmias. Arrhythmias are problems with the speed or rhythm of the heartbeat. The monitor is a small, portable device. You can wear one while you do your normal daily activities. This is usually used to diagnose what is causing palpitations/syncope (passing out).    Follow-Up: Your physician recommends that you schedule a follow-up appointment in: 6 weeks with APP on Dr. Norris Crossurner's team    Any Other Special Instructions Will Be Listed Below (If Applicable).     If you need a refill on your cardiac medications before your next appointment, please call your pharmacy.

## 2018-06-14 ENCOUNTER — Ambulatory Visit (HOSPITAL_COMMUNITY): Payer: Medicaid Other | Attending: Cardiology

## 2018-06-14 ENCOUNTER — Other Ambulatory Visit: Payer: Self-pay

## 2018-06-14 ENCOUNTER — Ambulatory Visit (INDEPENDENT_AMBULATORY_CARE_PROVIDER_SITE_OTHER): Payer: Medicaid Other

## 2018-06-14 DIAGNOSIS — R55 Syncope and collapse: Secondary | ICD-10-CM

## 2018-06-14 DIAGNOSIS — R002 Palpitations: Secondary | ICD-10-CM | POA: Diagnosis not present

## 2018-06-14 DIAGNOSIS — R079 Chest pain, unspecified: Secondary | ICD-10-CM | POA: Insufficient documentation

## 2018-06-14 DIAGNOSIS — R109 Unspecified abdominal pain: Secondary | ICD-10-CM | POA: Insufficient documentation

## 2018-06-14 DIAGNOSIS — G8929 Other chronic pain: Secondary | ICD-10-CM | POA: Insufficient documentation

## 2018-07-26 ENCOUNTER — Encounter: Payer: Self-pay | Admitting: Cardiology

## 2018-07-26 ENCOUNTER — Ambulatory Visit (INDEPENDENT_AMBULATORY_CARE_PROVIDER_SITE_OTHER): Payer: Medicaid Other | Admitting: Cardiology

## 2018-07-26 VITALS — BP 100/68 | HR 65 | Ht 67.0 in | Wt 134.0 lb

## 2018-07-26 DIAGNOSIS — R55 Syncope and collapse: Secondary | ICD-10-CM

## 2018-07-26 NOTE — Patient Instructions (Signed)
Medication Instructions:  Your physician recommends that you continue on your current medications as directed. Please refer to the Current Medication list given to you today.   Labwork: -None  Testing/Procedures: -None  Follow-Up: As needed  Any Other Special Instructions Will Be Listed Below (If Applicable).     If you need a refill on your cardiac medications before your next appointment, please call your pharmacy.   

## 2018-07-26 NOTE — Progress Notes (Signed)
07/26/2018 Krista Rodriguez   1986-06-12  [id number]  Primary Physician Shayne Alken Tawny Asal, NP Primary Cardiologist: Dr. Mayford Knife  Reason for Visit/CC: f/u for Syncope and Palpitations  HPI:  Krista Rodriguez is a 32 y.o. female who is being seen today for follow-up after recent evaluation for syncope and palpitations.  His medical history is significant for chronic abdominal pain, endometriosis and history of syncope and near syncope.  She was recently referred to Dr. Mayford Knife for evaluation for syncope and palpitations.  Records it appears that she has had several syncopal episodes throughout the years that have occurred in the setting of abdominal pain. Syncope has occurred with severe abdominal pain around her menstrual cycle and also when she had abdominal pain in the setting of acute gastroenteritis/ food poisoning.  She has also experience frequent palpitations.  June 2019 she was seen in the emergency department for syncope and palpitations however evaluation was benign.  She was subsequently referred to Dr. Mayford Knife for further evaluation.  Based on her symptom pattern, her syncope was felt to be most likely vasovagal syncope.  Dr. Mayford Knife elected to get a 30-day monitor as well as a 2D echocardiogram for further assessment.  Echo showed low normal left ventricular EF at 50 to 55% with normal wall motion.  There were no valvular abnormalities.  Wall thickness was also normal.  Her monitor was also benign, showing normal sinus rhythm with occasional mild bradycardia as well as occasional sinus tachycardia.  The average heart rate was 72 bpm.  Ranged from 44 246 bpm.  This was reviewed by Dr. Mayford Knife and findings were felt to be normal.  She presents back to clinic today to review study results and to reassess symptoms.  She reports that she has done well since her last office visit.  She denies any recurrent syncope or near syncope.  She has had some occasional palpitations but as outlined above her monitor was  benign.  She admits to recent increased stress and anxiety in her life.  She did have her menstrual cycle while wearing the monitor and reports less abdominal pain.  She has been abstaining from sexual activity which she reports has resulted in less abdominal/pelvic pain.  her resting heart rate in clinic today is 65 bpm.  Blood pressure is 100/68   Cardiac Studies   2D Echo 05/2018 Study Conclusions  - Left ventricle: The cavity size was normal. Wall thickness was   normal. Systolic function was normal. The estimated ejection   fraction was in the range of 50% to 55%. Wall motion was normal;   there were no regional wall motion abnormalities. Left   ventricular diastolic function parameters were normal.  Impressions:  - Normal LV systolic function and diastolic function.  30 Day Event Monitor 2019 Study Highlights    Sinus bradycardia, normal sinus rhythm and sinus tachycardia. The average heart rate was 72 bpm. Heart rate ranged from 44 to 146 bpm.     No outpatient medications have been marked as taking for the 07/26/18 encounter (Office Visit) with Allayne Butcher, PA-C.   Allergies  Allergen Reactions  . Icy Hot Rash   Past Medical History:  Diagnosis Date  . Abdominal pain   . Abnormal vaginal bleeding   . Acute sinusitis   . Anxiety   . Blurry vision   . Chest wall pain   . Depression   . Endometriosis   . Galactorrhea   . Gastroenteritis   . Headache   .  Heart murmur   . Heart palpitations 06/07/2018  . Intermittent palpitations   . Irregular periods   . Irritable bowel syndrome   . Menometrorrhagia   . Myalgia   . Nausea   . Pelvic pain in female   . Pinguecula, bilateral   . Positive Lyme disease serology   . Rib pain on right side   . Screening for depression   . Sebaceous cyst   . Skin tag   . Sprain of shoulder, right   . Strain of left trapezius muscle   . Syncope   . Tick bite   . Tobacco user   . Toe laceration   . UTI (urinary  tract infection)   . Vasovagal syncope 06/07/2018   No family history on file. Past Surgical History:  Procedure Laterality Date  . addenoidectomy    . TONSILLECTOMY     Social History   Socioeconomic History  . Marital status: Single    Spouse name: Not on file  . Number of children: Not on file  . Years of education: Not on file  . Highest education level: Not on file  Occupational History  . Not on file  Social Needs  . Financial resource strain: Not on file  . Food insecurity:    Worry: Not on file    Inability: Not on file  . Transportation needs:    Medical: Not on file    Non-medical: Not on file  Tobacco Use  . Smoking status: Former Smoker    Packs/day: 1.00    Types: Cigarettes    Last attempt to quit: 05/04/2018    Years since quitting: 0.2  . Smokeless tobacco: Never Used  Substance and Sexual Activity  . Alcohol use: Yes  . Drug use: No  . Sexual activity: Never  Lifestyle  . Physical activity:    Days per week: Not on file    Minutes per session: Not on file  . Stress: Not on file  Relationships  . Social connections:    Talks on phone: Not on file    Gets together: Not on file    Attends religious service: Not on file    Active member of club or organization: Not on file    Attends meetings of clubs or organizations: Not on file    Relationship status: Not on file  . Intimate partner violence:    Fear of current or ex partner: Not on file    Emotionally abused: Not on file    Physically abused: Not on file    Forced sexual activity: Not on file  Other Topics Concern  . Not on file  Social History Narrative  . Not on file     Review of Systems: General: negative for chills, fever, night sweats or weight changes.  Cardiovascular: negative for chest pain, dyspnea on exertion, edema, orthopnea, palpitations, paroxysmal nocturnal dyspnea or shortness of breath Dermatological: negative for rash Respiratory: negative for cough or  wheezing Urologic: negative for hematuria Abdominal: negative for nausea, vomiting, diarrhea, bright red blood per rectum, melena, or hematemesis Neurologic: negative for visual changes, syncope, or dizziness All other systems reviewed and are otherwise negative except as noted above.   Physical Exam:  Blood pressure 100/68, pulse 65, height 5\' 7"  (1.702 m), weight 134 lb (60.8 kg).  General appearance: alert, cooperative and no distress Neck: no carotid bruit and no JVD Lungs: clear to auscultation bilaterally Abdomen: soft, non-tender; bowel sounds normal; no masses,  no organomegaly  Extremities: extremities normal, atraumatic, no cyanosis or edema Skin: Skin color, texture, turgor normal. No rashes or lesions Neurologic: Grossly normal  EKG not performed -- personally reviewed   ASSESSMENT AND PLAN:   1. Syncope: suspect vasovagal syncope based on symptom pattern. This seems to be related to when she is having problems with abdominal/pelvic pain and around her menstrual cycles. 2D echo with normal LVEF, wall motion and wall thickness. No valvular abnormalities. 30 day event monitor benign w/o any significant arrhthymias. No further cardiac w/u recommended.   2. Palpitations: 30  Day monitor benign. Findings outlined above. Pt reassured.   3. Anxiety/ Stress: I recommended pt talk to PCP about possible PRN medication.   4. Abdominal/Pelvic Pain: pt reports h/o endometriosis. Pt encouraged to f/u with Gynecology.   PLAN  No further cardiac w/u.   Follow-Up PRN.   Jera Headings Delmer Islam, MHS [facility] 07/26/2018 10:39 AM

## 2019-06-18 IMAGING — CT CT ABD-PELV W/ CM
2 of 4 series · 15 of 46 positions shown, 17 images · IV contrast (APPLIED)
Comparison: 06/01/2017 abdominal radiographs. 08/21/2014 pelvic
sonogram. 09/30/2003 CT abdomen/pelvis.

CLINICAL DATA: Abdominal pain.

EXAM:
CT ABDOMEN AND PELVIS WITH CONTRAST
TECHNIQUE: Multidetector CT imaging of the abdomen and pelvis was performed
using the standard protocol following bolus administration of
intravenous contrast.
CONTRAST:  100mL KBCAZV-RWW IOPAMIDOL (KBCAZV-RWW) INJECTION 61%

[Series 4: abdomen 5.0 · axial · 0.60mm/px · z∈[-732,-352]mm · 12 of 86 slices shown, 14 images]
[im 5/86  soft-tissue]
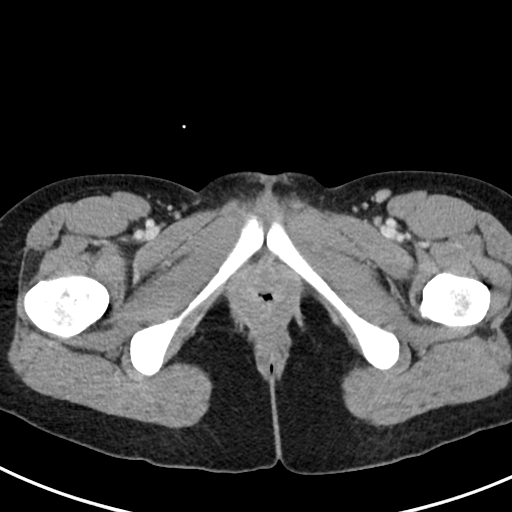
[im 5/86  bone]
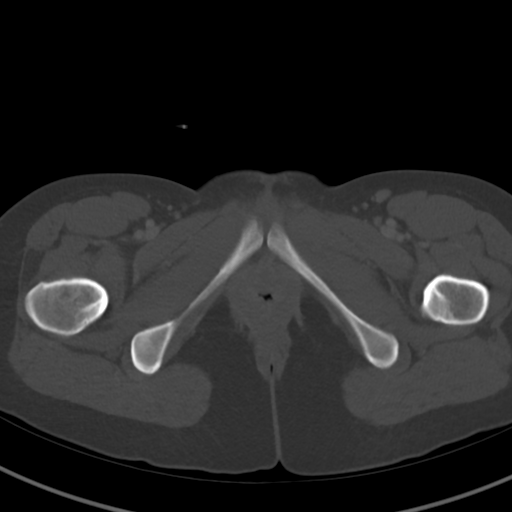
[im 14/86  soft-tissue]
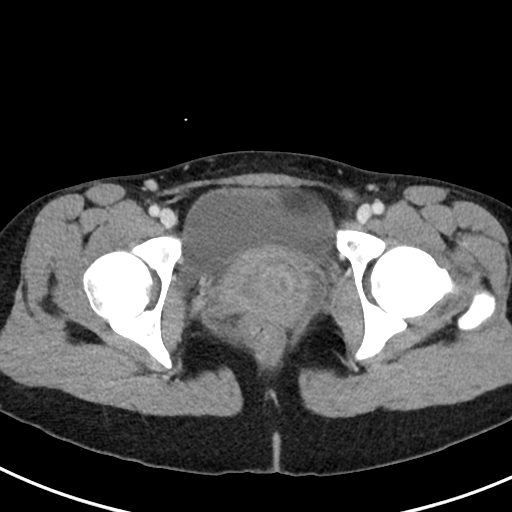
[im 18/86  soft-tissue]
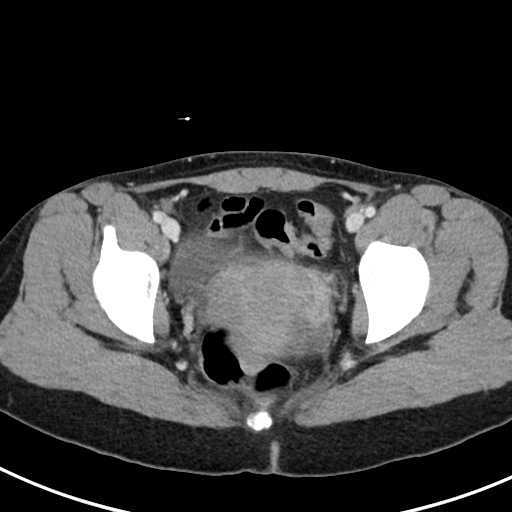
[im 27/86  soft-tissue]
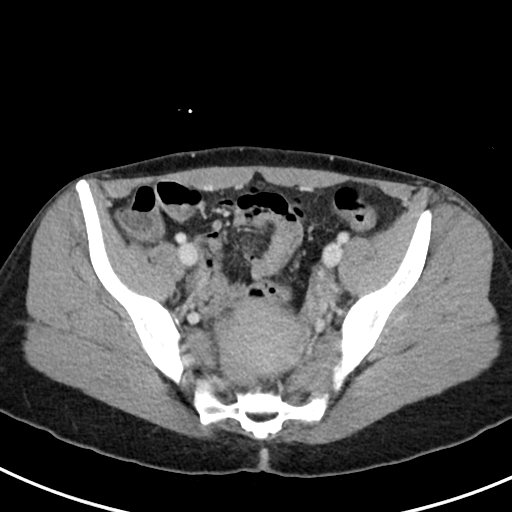
[im 32/86  soft-tissue]
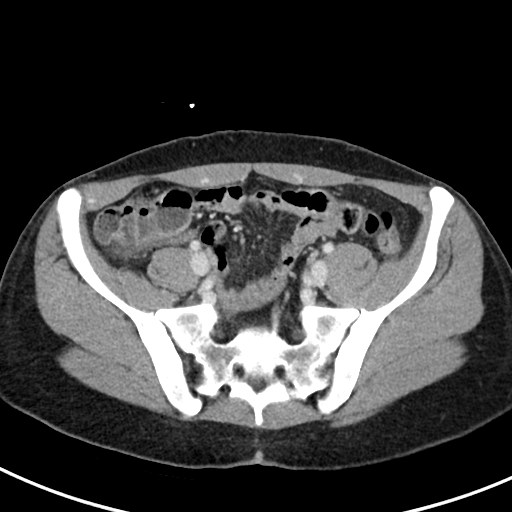
[im 41/86  soft-tissue]
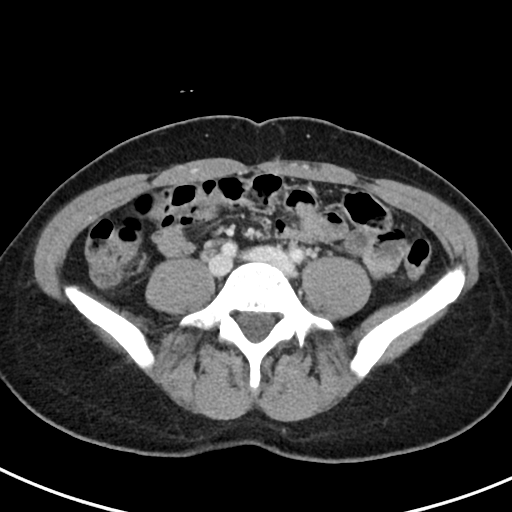
[im 45/86  soft-tissue]
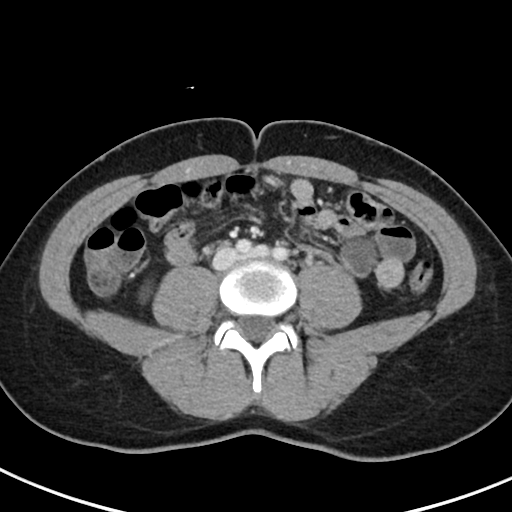
[im 54/86  soft-tissue]
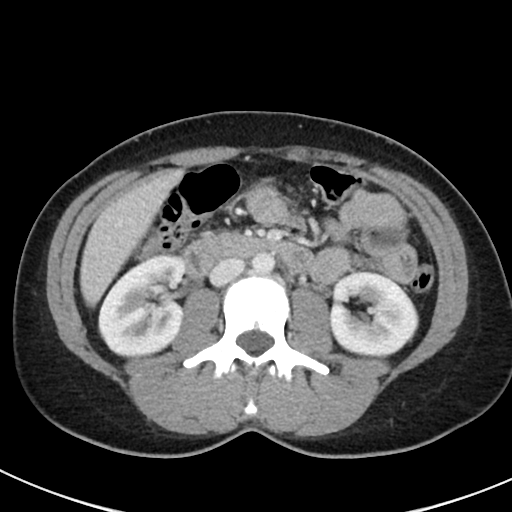
[im 59/86  soft-tissue]
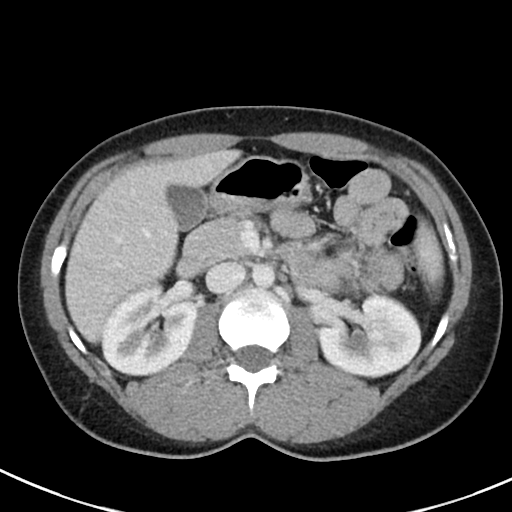
[im 59/86  bone]
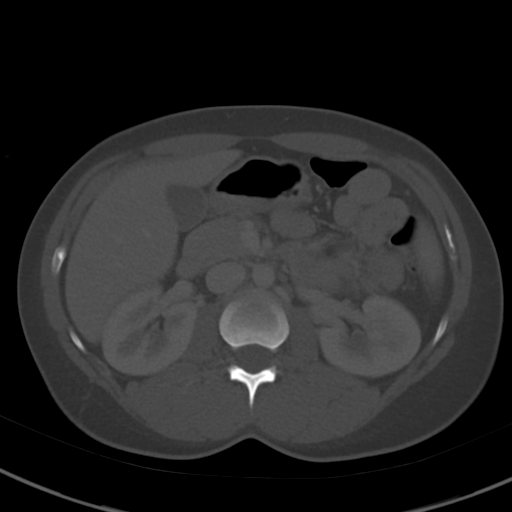
[im 68/86  soft-tissue]
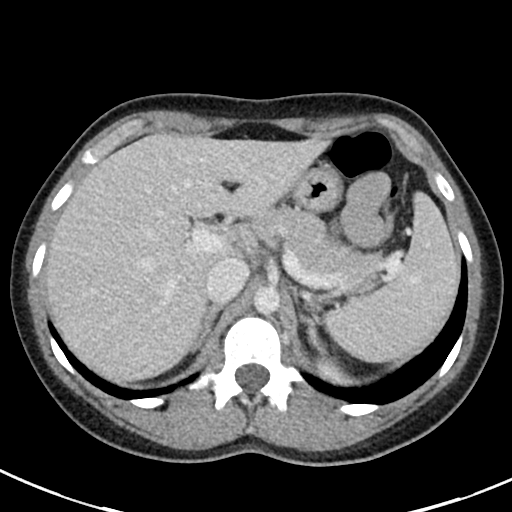
[im 72/86  soft-tissue]
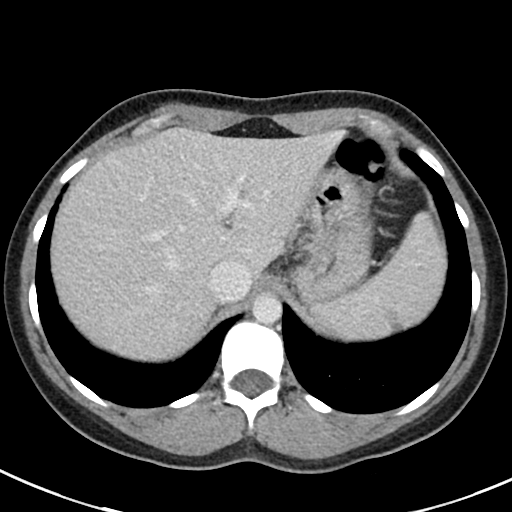
[im 81/86  soft-tissue]
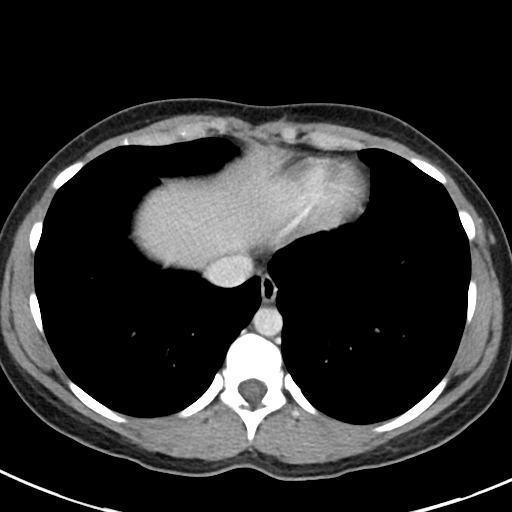

[Series 6: abdomen 3.0 mpr cor · coronal · 0.65mm/px · 3 of 71 slices shown]
[im 24/71  soft-tissue]
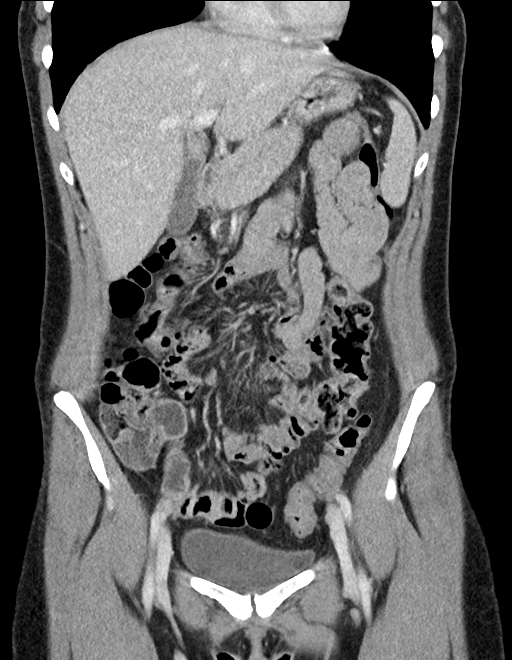
[im 32/71  soft-tissue]
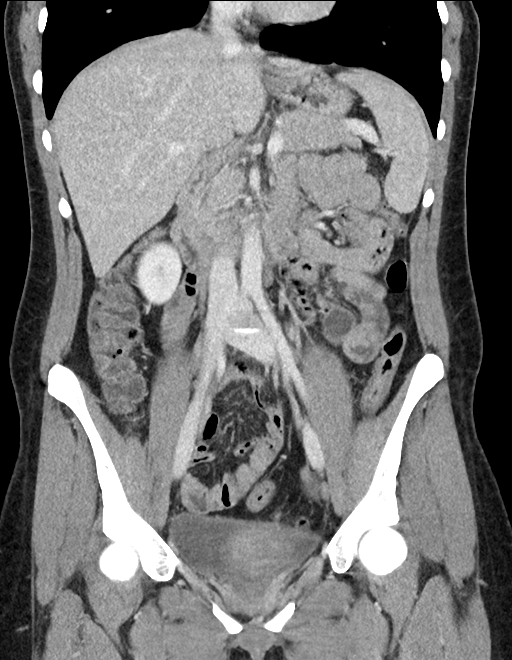
[im 39/71  soft-tissue]
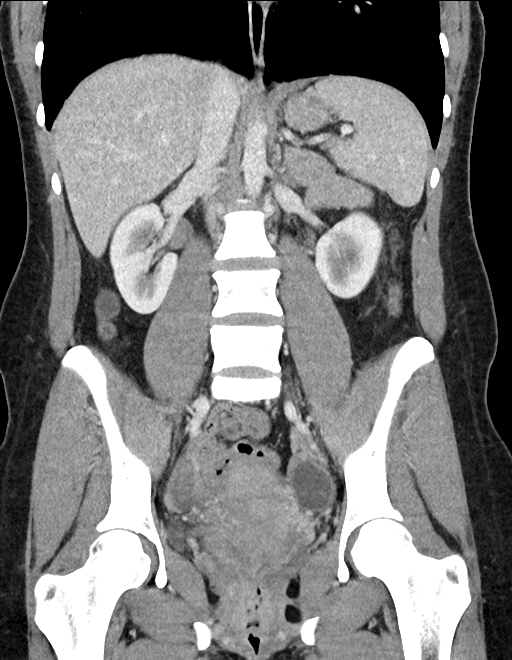

[15 of 46 positions shown; findings below may reference images not displayed]

FINDINGS: Lower chest: No significant pulmonary nodules or acute consolidative
airspace disease.

Hepatobiliary: Normal liver size. Sub 5 mm hypodense lateral segment
left liver lobe lesion, too small to characterize, not definitely
changed since 07/11/2011, considered benign. No additional liver
lesions. Normal gallbladder with no radiopaque cholelithiasis. No
biliary ductal dilatation.

Pancreas: Normal, with no mass or duct dilation.

Spleen: Normal size spleen. Cystic 1.6 cm central splenic lesion
with mural calcifications, previously 2.4 cm on 09/30/2003,
decreased, considered benign. No new splenic lesions.

Adrenals/Urinary Tract: Normal adrenals. Normal kidneys with no
hydronephrosis and no renal mass. Normal bladder.

Stomach/Bowel: Grossly normal stomach. Normal caliber small bowel
with no small bowel wall thickening. Normal appendix. Normal large
bowel with no diverticulosis, large bowel wall thickening or
pericolonic fat stranding.

Vascular/Lymphatic: Normal caliber abdominal aorta. Patent portal,
splenic, hepatic and renal veins. No pathologically enlarged lymph
nodes in the abdomen or pelvis.

Reproductive: Mildly enlarged globular uterus. Left adnexal 3.5 cm
cyst. No additional adnexal lesions.

Other: No pneumoperitoneum, ascites or focal fluid collection.

Musculoskeletal: No aggressive appearing focal osseous lesions.
IMPRESSION: 1. No evidence of bowel obstruction or acute bowel inflammation.
Normal appendix.
2. Left adnexal 3.5 cm cyst, for which no further evaluation is
required. This recommendation follows ACR consensus guidelines:
White Paper of the ACR Incidental Findings Committee II on Adnexal
Findings. [HOSPITAL] [DATE].
3. Mildly enlarged globular uterus, nonspecific findings that can be
seen with uterine adenomyosis. Pelvic ultrasound correlation could
be obtained as clinically warranted.

## 2019-10-06 ENCOUNTER — Other Ambulatory Visit: Payer: Self-pay

## 2019-10-06 ENCOUNTER — Encounter (HOSPITAL_COMMUNITY): Payer: Self-pay | Admitting: *Deleted

## 2019-10-06 ENCOUNTER — Inpatient Hospital Stay (HOSPITAL_COMMUNITY)
Admission: EM | Admit: 2019-10-06 | Discharge: 2019-10-06 | Disposition: A | Discharge status: Home / Self Care | Payer: Medicaid Other | Attending: Obstetrics and Gynecology | Admitting: Obstetrics and Gynecology

## 2019-10-06 ENCOUNTER — Inpatient Hospital Stay (HOSPITAL_COMMUNITY): Payer: Medicaid Other

## 2019-10-06 DIAGNOSIS — O26891 Other specified pregnancy related conditions, first trimester: Secondary | ICD-10-CM | POA: Diagnosis not present

## 2019-10-06 DIAGNOSIS — Z3A01 Less than 8 weeks gestation of pregnancy: Secondary | ICD-10-CM | POA: Insufficient documentation

## 2019-10-06 DIAGNOSIS — M549 Dorsalgia, unspecified: Secondary | ICD-10-CM | POA: Insufficient documentation

## 2019-10-06 DIAGNOSIS — O468X1 Other antepartum hemorrhage, first trimester: Secondary | ICD-10-CM | POA: Diagnosis not present

## 2019-10-06 DIAGNOSIS — O209 Hemorrhage in early pregnancy, unspecified: Secondary | ICD-10-CM | POA: Diagnosis not present

## 2019-10-06 DIAGNOSIS — O418X10 Other specified disorders of amniotic fluid and membranes, first trimester, not applicable or unspecified: Secondary | ICD-10-CM | POA: Diagnosis not present

## 2019-10-06 DIAGNOSIS — O200 Threatened abortion: Secondary | ICD-10-CM | POA: Diagnosis not present

## 2019-10-06 DIAGNOSIS — R109 Unspecified abdominal pain: Secondary | ICD-10-CM | POA: Insufficient documentation

## 2019-10-06 DIAGNOSIS — Z369 Encounter for antenatal screening, unspecified: Secondary | ICD-10-CM | POA: Diagnosis not present

## 2019-10-06 LAB — OB RESULTS CONSOLE GC/CHLAMYDIA
Chlamydia: NEGATIVE
Gonorrhea: NEGATIVE

## 2019-10-06 LAB — CBC
HCT: 34.3 % — ABNORMAL LOW (ref 36.0–46.0)
Hemoglobin: 11.9 g/dL — ABNORMAL LOW (ref 12.0–15.0)
MCH: 29.8 pg (ref 26.0–34.0)
MCHC: 34.7 g/dL (ref 30.0–36.0)
MCV: 85.8 fL (ref 80.0–100.0)
Platelets: 295 10*3/uL (ref 150–400)
RBC: 4 MIL/uL (ref 3.87–5.11)
RDW: 12.6 % (ref 11.5–15.5)
WBC: 6.1 10*3/uL (ref 4.0–10.5)
nRBC: 0 % (ref 0.0–0.2)

## 2019-10-06 LAB — URINALYSIS, ROUTINE W REFLEX MICROSCOPIC
Bilirubin Urine: NEGATIVE
Glucose, UA: NEGATIVE mg/dL
Ketones, ur: NEGATIVE mg/dL
Leukocytes,Ua: NEGATIVE
Nitrite: NEGATIVE
Protein, ur: NEGATIVE mg/dL
RBC / HPF: >50 RBC/hpf — ABNORMAL HIGH (ref 0–5)
Specific Gravity, Urine: 1.011 (ref 1.005–1.030)
pH: 6 (ref 5.0–8.0)

## 2019-10-06 LAB — OB RESULTS CONSOLE RUBELLA ANTIBODY, IGM: Rubella: IMMUNE

## 2019-10-06 LAB — WET PREP, GENITAL
Clue Cells Wet Prep HPF POC: NONE SEEN
Sperm: NONE SEEN
Trich, Wet Prep: NONE SEEN
Yeast Wet Prep HPF POC: NONE SEEN

## 2019-10-06 LAB — OB RESULTS CONSOLE HIV ANTIBODY (ROUTINE TESTING): HIV: NONREACTIVE

## 2019-10-06 LAB — OB RESULTS CONSOLE HEPATITIS B SURFACE ANTIGEN: Hepatitis B Surface Ag: NEGATIVE

## 2019-10-06 LAB — ABO/RH: ABO/RH(D): O POS

## 2019-10-06 LAB — HIV ANTIBODY (ROUTINE TESTING W REFLEX): HIV Screen 4th Generation wRfx: NONREACTIVE

## 2019-10-06 LAB — OB RESULTS CONSOLE ABO/RH: RH Type: POSITIVE

## 2019-10-06 LAB — OB RESULTS CONSOLE RPR: RPR: NONREACTIVE

## 2019-10-06 LAB — HCG, QUANTITATIVE, PREGNANCY: hCG, Beta Chain, Quant, S: 5831 m[IU]/mL — ABNORMAL HIGH (ref <5)

## 2019-10-06 LAB — POCT PREGNANCY, URINE: Preg Test, Ur: POSITIVE — AB

## 2019-10-06 LAB — OB RESULTS CONSOLE ANTIBODY SCREEN: Antibody Screen: NEGATIVE

## 2019-10-06 NOTE — MAU Provider Note (Signed)
Chief Complaint: Abdominal Pain, Back Pain, and Vaginal Bleeding   First Provider Initiated Contact with Patient 10/06/19 1312      SUBJECTIVE HPI: Krista Rodriguez is a 33 y.o. G3P2002 at 6949w3d by LMP who presents to maternity admissions reporting cramping x 1-2 weeks that is mild with onset of dark red bleeding, soaking her underwear this morning.  Her pain is low in her abdomen, intermittent, more severe in intensity since bleeding started and does not radiete. There are no other symptoms. She has not tried any treatments.      HPI  Past Medical History:  Diagnosis Date  . Abdominal pain   . Abnormal vaginal bleeding   . Acute sinusitis   . Blurry vision   . Chest wall pain   . Endometriosis   . Galactorrhea   . Gastroenteritis   . Headache   . Heart murmur   . Heart palpitations 06/07/2018  . Intermittent palpitations   . Irregular periods   . Irritable bowel syndrome   . Menometrorrhagia   . Myalgia   . Nausea   . Pelvic pain in female   . Pinguecula, bilateral   . Positive Lyme disease serology   . Rib pain on right side   . Screening for depression   . Sebaceous cyst   . Skin tag   . Sprain of shoulder, right   . Strain of left trapezius muscle   . Syncope   . Tick bite   . Tobacco user   . Toe laceration   . UTI (urinary tract infection)   . Vasovagal syncope 06/07/2018   Past Surgical History:  Procedure Laterality Date  . addenoidectomy    . TONSILLECTOMY    . WISDOM TOOTH EXTRACTION     Social History   Socioeconomic History  . Marital status: Single    Spouse name: Not on file  . Number of children: Not on file  . Years of education: Not on file  . Highest education level: Not on file  Occupational History  . Not on file  Social Needs  . Financial resource strain: Not on file  . Food insecurity    Worry: Not on file    Inability: Not on file  . Transportation needs    Medical: Not on file    Non-medical: Not on file  Tobacco Use  .  Smoking status: Former Smoker    Packs/day: 1.00    Types: Cigarettes    Quit date: 05/04/2018    Years since quitting: 1.4  . Smokeless tobacco: Never Used  Substance and Sexual Activity  . Alcohol use: Not Currently    Frequency: Never  . Drug use: No  . Sexual activity: Yes    Birth control/protection: None  Lifestyle  . Physical activity    Days per week: Not on file    Minutes per session: Not on file  . Stress: Not on file  Relationships  . Social Musicianconnections    Talks on phone: Not on file    Gets together: Not on file    Attends religious service: Not on file    Active member of club or organization: Not on file    Attends meetings of clubs or organizations: Not on file    Relationship status: Not on file  . Intimate partner violence    Fear of current or ex partner: Not on file    Emotionally abused: Not on file    Physically abused: Not on file  Forced sexual activity: Not on file  Other Topics Concern  . Not on file  Social History Narrative  . Not on file   No current facility-administered medications on file prior to encounter.    No current outpatient medications on file prior to encounter.   Allergies  Allergen Reactions  . Icy Hot Rash    ROS:  Review of Systems  Constitutional: Negative for chills, fatigue and fever.  Respiratory: Negative for shortness of breath.   Cardiovascular: Negative for chest pain.  Gastrointestinal: Positive for abdominal pain.  Genitourinary: Positive for pelvic pain and vaginal bleeding. Negative for difficulty urinating, dysuria, flank pain, vaginal discharge and vaginal pain.  Neurological: Negative for dizziness and headaches.  Psychiatric/Behavioral: Negative.      I have reviewed patient's Past Medical Hx, Surgical Hx, Family Hx, Social Hx, medications and allergies.   Physical Exam   Patient Vitals for the past 24 hrs:  BP Temp Temp src Pulse Resp SpO2  10/06/19 1149 (!) 118/50 98.1 F (36.7 C) Oral 99  17 99 %  10/06/19 1123 120/77 - - (!) 107 16 100 %   Constitutional: Well-developed, well-nourished female in no acute distress.  Cardiovascular: normal rate Respiratory: normal effort GI: Abd soft, non-tender. Pos BS x 4 MS: Extremities nontender, no edema, normal ROM Neurologic: Alert and oriented x 4.  GU: Neg CVAT.  PELVIC EXAM: Cervix pink, visually closed, without lesion, small amount dark red bleeding not requiring a fox swab to visualize cervix, vaginal walls and external genitalia normal Bimanual exam: Cervix 0/long/high, firm, anterior, neg CMT, uterus nontender, nonenlarged, adnexa without tenderness, enlargement, or mass   LAB RESULTS Results for orders placed or performed during the hospital encounter of 10/06/19 (from the past 24 hour(s))  Pregnancy, urine POC     Status: Abnormal   Collection Time: 10/06/19 11:59 AM  Result Value Ref Range   Preg Test, Ur POSITIVE (A) NEGATIVE  Urinalysis, Routine w reflex microscopic     Status: Abnormal   Collection Time: 10/06/19 12:01 PM  Result Value Ref Range   Color, Urine YELLOW YELLOW   APPearance CLEAR CLEAR   Specific Gravity, Urine 1.011 1.005 - 1.030   pH 6.0 5.0 - 8.0   Glucose, UA NEGATIVE NEGATIVE mg/dL   Hgb urine dipstick LARGE (A) NEGATIVE   Bilirubin Urine NEGATIVE NEGATIVE   Ketones, ur NEGATIVE NEGATIVE mg/dL   Protein, ur NEGATIVE NEGATIVE mg/dL   Nitrite NEGATIVE NEGATIVE   Leukocytes,Ua NEGATIVE NEGATIVE   RBC / HPF >50 (H) 0 - 5 RBC/hpf   WBC, UA 0-5 0 - 5 WBC/hpf   Bacteria, UA RARE (A) NONE SEEN   Mucus PRESENT   Wet prep, genital     Status: Abnormal   Collection Time: 10/06/19  1:24 PM   Specimen: PATH Cytology Cervicovaginal Ancillary Only  Result Value Ref Range   Yeast Wet Prep HPF POC NONE SEEN NONE SEEN   Trich, Wet Prep NONE SEEN NONE SEEN   Clue Cells Wet Prep HPF POC NONE SEEN NONE SEEN   WBC, Wet Prep HPF POC FEW (A) NONE SEEN   Sperm NONE SEEN   CBC     Status: Abnormal    Collection Time: 10/06/19  1:25 PM  Result Value Ref Range   WBC 6.1 4.0 - 10.5 K/uL   RBC 4.00 3.87 - 5.11 MIL/uL   Hemoglobin 11.9 (L) 12.0 - 15.0 g/dL   HCT 34.3 (L) 36.0 - 46.0 %   MCV 85.8  80.0 - 100.0 fL   MCH 29.8 26.0 - 34.0 pg   MCHC 34.7 30.0 - 36.0 g/dL   RDW 16.1 09.6 - 04.5 %   Platelets 295 150 - 400 K/uL   nRBC 0.0 0.0 - 0.2 %  hCG, quantitative, pregnancy     Status: Abnormal   Collection Time: 10/06/19  1:25 PM  Result Value Ref Range   hCG, Beta Chain, Quant, S 5,831 (H) <5 mIU/mL  HIV antibody     Status: None   Collection Time: 10/06/19  1:25 PM  Result Value Ref Range   HIV Screen 4th Generation wRfx NON REACTIVE NON REACTIVE  ABO/Rh     Status: None   Collection Time: 10/06/19  1:25 PM  Result Value Ref Range   ABO/RH(D) O POS    No rh immune globuloin      NOT A RH IMMUNE GLOBULIN CANDIDATE, PT RH POSITIVE Performed at [facility], [address], [place], [place] [address]     --/--/O POS (11/15 1325)  IMAGING US Ob Less Than 14 Weeks With Ob Transvaginal  Result Date: 10/06/2019 CLINICAL DATA:  Initial evaluation for acute vaginal bleeding, early pregnancy. EXAM: OBSTETRIC <14 WK [place] AND TRANSVAGINAL OB US TECHNIQUE: Both transabdominal and transvaginal ultrasound examinations were performed for complete evaluation of the gestation as well as the maternal uterus, adnexal regions, and pelvic cul-de-sac. Transvaginal technique was performed to assess early pregnancy. COMPARISON:  None. FINDINGS: Intrauterine gestational sac: Single Yolk sac:  Present Embryo:  Not visualized. Cardiac Activity: Negative. Heart Rate: N/A  bpm MSD: 7.1 mm   5 w   3 d Subchorionic hemorrhage: Small subchorionic hemorrhage without mass effect. Maternal uterus/adnexae: Ovaries are normal in appearance bilaterally. Small corpus luteal cyst noted on the left. Associated small volume free fluid within the pelvis. IMPRESSION: 1. Early intrauterine gestational sac with internal  yolk sac, but no fetal pole or cardiac activity yet visualized. Estimated gestational age [redacted] weeks and 3 days by mean sac diameter. Recommend follow-up quantitative B-HCG levels and follow-up US in 14 days to confirm and assess viability. 2. Small subchorionic hemorrhage without associated mass effect. 3. No other acute maternal uterine or adnexal abnormality identified. Electronically Signed   By: Rise Mu M.D.   On: 10/06/2019 14:19    MAU Management/MDM: Orders Placed This Encounter  Procedures  . Wet prep, genital  . US OB LESS THAN 14 WEEKS WITH OB TRANSVAGINAL  . Urinalysis, Routine w reflex microscopic  . CBC  . hCG, quantitative, pregnancy  . HIV antibody  . Pregnancy, urine POC  . ABO/Rh  . Discharge patient    No orders of the defined types were placed in this encounter.   [place] with gestational sac and yolk sac today so ectopic essentially ruled out but pt with with significant bleeding. Subchorionic hemorrhage noted today as well so likely source of bleeding.  Reviewed [place] results with pt and husband and questions answered.  Pt to return to MAU in 48 hours for repeat hcg or sooner with emergencies.  Pt discharged with strict bleeding precautions.  ASSESSMENT 1. Threatened miscarriage in early pregnancy   2. Vaginal bleeding in pregnancy, first trimester   3. Subchorionic hemorrhage of placenta in first trimester, single or unspecified fetus     PLAN Discharge home Allergies as of 10/06/2019      Reactions   Icy Hot Rash      Medication List    You have not been  prescribed any medications.    Follow-up Information    [facility] Follow up.   Why: Return to MAU on Tuesday, 10/08/19 for repeat labs or return sooner for emergencies. Contact information: [address] [place] [phone]          Sharen Counter Certified Nurse-Midwife 10/06/2019  3:34 PM

## 2019-10-06 NOTE — MAU Note (Addendum)
Pt states she started having some bright red bleeding today in church around 1045. Has not had any pads on but states she has bled through her underwear and her dress and that she has had cramping ever since she found out she was pregnant and it has been consistent in severity. LMP 08/29/2019.

## 2019-10-07 LAB — GC/CHLAMYDIA PROBE AMP (~~LOC~~) NOT AT ARMC
Chlamydia: NEGATIVE
Comment: NEGATIVE
Comment: NORMAL
Neisseria Gonorrhea: NEGATIVE

## 2019-10-08 ENCOUNTER — Other Ambulatory Visit: Payer: Self-pay

## 2019-10-08 ENCOUNTER — Inpatient Hospital Stay (HOSPITAL_COMMUNITY)
Admission: AD | Admit: 2019-10-08 | Discharge: 2019-10-08 | Disposition: A | Discharge status: Home / Self Care | Payer: Medicaid Other | Attending: Obstetrics & Gynecology | Admitting: Obstetrics & Gynecology

## 2019-10-08 DIAGNOSIS — Z3490 Encounter for supervision of normal pregnancy, unspecified, unspecified trimester: Secondary | ICD-10-CM

## 2019-10-08 DIAGNOSIS — O26891 Other specified pregnancy related conditions, first trimester: Secondary | ICD-10-CM | POA: Insufficient documentation

## 2019-10-08 DIAGNOSIS — Z3491 Encounter for supervision of normal pregnancy, unspecified, first trimester: Secondary | ICD-10-CM

## 2019-10-08 DIAGNOSIS — Z3A01 Less than 8 weeks gestation of pregnancy: Secondary | ICD-10-CM | POA: Insufficient documentation

## 2019-10-08 LAB — HCG, QUANTITATIVE, PREGNANCY: hCG, Beta Chain, Quant, S: 12455 m[IU]/mL — ABNORMAL HIGH (ref <5)

## 2019-10-08 NOTE — MAU Note (Signed)
Doing well. Denies any bleeding since Sunday, no pain.

## 2019-10-08 NOTE — MAU Provider Note (Signed)
Subjective:  Krista Rodriguez is a 33 y.o. G3P2002 at [redacted]w[redacted]d who presents today for FU BHCG. She was seen on 10/06/2019. Results from that day show IUP (from yolk sac) on [place], and HCG 5,831. She denies vaginal bleeding. She denies abdominal or pelvic pain. Pt was seen two days ago for bleeding and was concerned about miscarriage and here today to f/u on hCG levels.  Objective:  Physical Exam  Nursing note and vitals reviewed. Patient Vitals for the past 24 hrs:  BP Temp Temp src Pulse Resp SpO2  10/08/19 1543 (!) 112/52 98.5 F (36.9 C) Oral 74 16 100 %   Constitutional: She is oriented to person, place, and time. She appears well-developed and well-nourished. No distress.  HENT:  Head: Normocephalic.  Cardiovascular: Normal rate.  Respiratory: Effort normal.  GI: Soft. There is no tenderness.  Neurological: She is alert and oriented to person, place, and time. Skin: Skin is warm and dry.  Psychiatric: She has a normal mood and affect.   Results for orders placed or performed during the hospital encounter of 10/08/19 (from the past 24 hour(s))  hCG, quantitative, pregnancy     Status: Abnormal   Collection Time: 10/08/19  3:42 PM  Result Value Ref Range   hCG, Beta Chain, Quant, S 12,455 (H) <5 mIU/mL    Assessment/Plan: IUP HCG did  rise appropriately FU in 2 weeks for: viability [place] Pt advised to set-up NOB appt Return MAU precautions discussed

## 2019-10-22 ENCOUNTER — Other Ambulatory Visit: Payer: Self-pay

## 2019-10-22 ENCOUNTER — Ambulatory Visit (INDEPENDENT_AMBULATORY_CARE_PROVIDER_SITE_OTHER): Payer: Medicaid Other

## 2019-10-22 ENCOUNTER — Encounter: Payer: Self-pay | Admitting: Family Medicine

## 2019-10-22 ENCOUNTER — Ambulatory Visit (HOSPITAL_COMMUNITY)
Admission: RE | Admit: 2019-10-22 | Discharge: 2019-10-22 | Disposition: A | Discharge status: Home / Self Care | Payer: Medicaid Other | Source: Ambulatory Visit | Attending: Women's Health | Admitting: Women's Health

## 2019-10-22 ENCOUNTER — Other Ambulatory Visit (HOSPITAL_COMMUNITY): Payer: Self-pay | Admitting: Women's Health

## 2019-10-22 DIAGNOSIS — Z3A01 Less than 8 weeks gestation of pregnancy: Secondary | ICD-10-CM

## 2019-10-22 DIAGNOSIS — Z3490 Encounter for supervision of normal pregnancy, unspecified, unspecified trimester: Secondary | ICD-10-CM | POA: Diagnosis not present

## 2019-10-22 DIAGNOSIS — O3680X0 Pregnancy with inconclusive fetal viability, not applicable or unspecified: Secondary | ICD-10-CM

## 2019-10-22 NOTE — Progress Notes (Signed)
ATTESTATION OF SUPERVISION OF RN: Evaluation and management procedures were performed by the RN under my supervision and collaboration. I have reviewed the nursing note and chart and agree with the management and plan for this patient.  Chamar Broughton, CNM  

## 2019-10-22 NOTE — Progress Notes (Signed)
Pt here today for OB US results for viability.  Notified Mallie Snooks, CNM pt's results.  Pt informed that she has a viable pregnancy with EDD 06/02/20, 7w 3d today, and FHR 157.  Pt denies any pain or bleeding.  Pt reports that she an OB provider already she is just waiting on a return call back.  [place] pics provided.  [place] office to provide proof of pregnancy letter.    Mel Almond, RN 10/22/19

## 2019-10-23 ENCOUNTER — Telehealth: Payer: Self-pay

## 2019-10-23 NOTE — Telephone Encounter (Signed)
Pt called requesting that her [place] and records be sent to [place] for prenatal care.  Called pt and informed pt that we would need a ROI to send those records.  Pt states that [facility] needs records to confirm her pelvic rest order.  Pt informed that I will transfer her call to the front office so that can help make the best decision.  Pt verbalized understanding with no further questions.

## 2019-11-22 NOTE — L&D Delivery Note (Signed)
Delivery Note Patient pushed with 3 contractions.  7:34 PM a viable female was delivered via Vaginal, Spontaneous (Presentation: Right Occiput Anterior).  APGAR: 9, 9; weight 4329 g (9 lb 8.7 oz).   Placenta status: Spontaneous, Intact.  Cord: 3 vessels with the following complications: None.  Cord pH: n/a  Anesthesia: Epidural Episiotomy: None Lacerations: None Suture Repair: 2.0 vicryl rapide Est. Blood Loss (mL):  100 mL  Mom to postpartum.  Baby to Couplet care / Skin to Skin.  [facility] GEFFEL Donnae Michels 06/10/2020, 7:54 PM

## 2020-04-11 ENCOUNTER — Encounter (HOSPITAL_COMMUNITY): Payer: Self-pay | Admitting: Obstetrics and Gynecology

## 2020-04-11 ENCOUNTER — Inpatient Hospital Stay (HOSPITAL_COMMUNITY)
Admission: AD | Admit: 2020-04-11 | Discharge: 2020-04-11 | Disposition: A | Discharge status: Home / Self Care | Payer: Medicaid Other | Attending: Obstetrics and Gynecology | Admitting: Obstetrics and Gynecology

## 2020-04-11 ENCOUNTER — Other Ambulatory Visit: Payer: Self-pay

## 2020-04-11 DIAGNOSIS — K589 Irritable bowel syndrome without diarrhea: Secondary | ICD-10-CM | POA: Diagnosis not present

## 2020-04-11 DIAGNOSIS — O4703 False labor before 37 completed weeks of gestation, third trimester: Secondary | ICD-10-CM

## 2020-04-11 DIAGNOSIS — Z3A32 32 weeks gestation of pregnancy: Secondary | ICD-10-CM | POA: Insufficient documentation

## 2020-04-11 DIAGNOSIS — O23593 Infection of other part of genital tract in pregnancy, third trimester: Secondary | ICD-10-CM | POA: Diagnosis not present

## 2020-04-11 DIAGNOSIS — B9689 Other specified bacterial agents as the cause of diseases classified elsewhere: Secondary | ICD-10-CM | POA: Diagnosis not present

## 2020-04-11 DIAGNOSIS — O99613 Diseases of the digestive system complicating pregnancy, third trimester: Secondary | ICD-10-CM | POA: Insufficient documentation

## 2020-04-11 DIAGNOSIS — Z87891 Personal history of nicotine dependence: Secondary | ICD-10-CM | POA: Insufficient documentation

## 2020-04-11 DIAGNOSIS — Z3689 Encounter for other specified antenatal screening: Secondary | ICD-10-CM

## 2020-04-11 LAB — WET PREP, GENITAL
Sperm: NONE SEEN
Trich, Wet Prep: NONE SEEN
Yeast Wet Prep HPF POC: NONE SEEN

## 2020-04-11 LAB — URINALYSIS, ROUTINE W REFLEX MICROSCOPIC
Bilirubin Urine: NEGATIVE
Glucose, UA: NEGATIVE mg/dL
Hgb urine dipstick: NEGATIVE
Ketones, ur: NEGATIVE mg/dL
Leukocytes,Ua: NEGATIVE
Nitrite: NEGATIVE
Protein, ur: NEGATIVE mg/dL
Specific Gravity, Urine: 1.004 — ABNORMAL LOW (ref 1.005–1.030)
pH: 6 (ref 5.0–8.0)

## 2020-04-11 LAB — FETAL FIBRONECTIN: Fetal Fibronectin: NEGATIVE

## 2020-04-11 MED ORDER — METRONIDAZOLE 0.75 % VA GEL: 1.0000 | Freq: Every day | VAGINAL | 0 refills | Status: DC

## 2020-04-11 NOTE — Discharge Instructions (Signed)
Bacterial Vaginosis  Bacterial vaginosis is a vaginal infection that occurs when the normal balance of bacteria in the vagina is disrupted. It results from an overgrowth of certain bacteria. This is the most common vaginal infection among women ages 15-44. Because bacterial vaginosis increases your risk for STIs (sexually transmitted infections), getting treated can help reduce your risk for chlamydia, gonorrhea, herpes, and HIV (human immunodeficiency virus). Treatment is also important for preventing complications in pregnant women, because this condition can cause an early (premature) delivery. What are the causes? This condition is caused by an increase in harmful bacteria that are normally present in small amounts in the vagina. However, the reason that the condition develops is not fully understood. What increases the risk? The following factors may make you more likely to develop this condition:  Having a new sexual partner or multiple sexual partners.  Having unprotected sex.  Douching.  Having an intrauterine device (IUD).  Smoking.  Drug and alcohol abuse.  Taking certain antibiotic medicines.  Being pregnant. You cannot get bacterial vaginosis from toilet seats, bedding, swimming pools, or contact with objects around you. What are the signs or symptoms? Symptoms of this condition include:  Grey or white vaginal discharge. The discharge can also be watery or foamy.  A fish-like odor with discharge, especially after sexual intercourse or during menstruation.  Itching in and around the vagina.  Burning or pain with urination. Some women with bacterial vaginosis have no signs or symptoms. How is this diagnosed? This condition is diagnosed based on:  Your medical history.  A physical exam of the vagina.  Testing a sample of vaginal fluid under a microscope to look for a large amount of bad bacteria or abnormal cells. Your health care provider may use a cotton swab or  a small wooden spatula to collect the sample. How is this treated? This condition is treated with antibiotics. These may be given as a pill, a vaginal cream, or a medicine that is put into the vagina (suppository). If the condition comes back after treatment, a second round of antibiotics may be needed. Follow these instructions at home: Medicines  Take over-the-counter and prescription medicines only as told by your health care provider.  Take or use your antibiotic as told by your health care provider. Do not stop taking or using the antibiotic even if you start to feel better. General instructions  If you have a female sexual partner, tell her that you have a vaginal infection. She should see her health care provider and be treated if she has symptoms. If you have a female sexual partner, he does not need treatment.  During treatment: ? Avoid sexual activity until you finish treatment. ? Do not douche. ? Avoid alcohol as directed by your health care provider. ? Avoid breastfeeding as directed by your health care provider.  Drink enough water and fluids to keep your urine clear or pale yellow.  Keep the area around your vagina and rectum clean. ? Wash the area daily with warm water. ? Wipe yourself from front to back after using the toilet.  Keep all follow-up visits as told by your health care provider. This is important. How is this prevented?  Do not douche.  Wash the outside of your vagina with warm water only.  Use protection when having sex. This includes latex condoms and dental dams.  Limit how many sexual partners you have. To help prevent bacterial vaginosis, it is best to have sex with just one partner (  monogamous).  Make sure you and your sexual partner are tested for STIs.  Wear cotton or cotton-lined underwear.  Avoid wearing tight pants and pantyhose, especially during summer.  Limit the amount of alcohol that you drink.  Do not use any products that contain  nicotine or tobacco, such as cigarettes and e-cigarettes. If you need help quitting, ask your health care provider.  Do not use illegal drugs. Where to find more information  Centers for Disease Control and Prevention: www.cdc.gov/std  American Sexual Health Association (ASHA): www.ashastd.org  U.S. Department of Health and Human Services, Office on Women's Health: www.womenshealth.gov/ or https://www.womenshealth.gov/a-z-topics/bacterial-vaginosis Contact a health care provider if:  Your symptoms do not improve, even after treatment.  You have more discharge or pain when urinating.  You have a fever.  You have pain in your abdomen.  You have pain during sex.  You have vaginal bleeding between periods. Summary  Bacterial vaginosis is a vaginal infection that occurs when the normal balance of bacteria in the vagina is disrupted.  Because bacterial vaginosis increases your risk for STIs (sexually transmitted infections), getting treated can help reduce your risk for chlamydia, gonorrhea, herpes, and HIV (human immunodeficiency virus). Treatment is also important for preventing complications in pregnant women, because the condition can cause an early (premature) delivery.  This condition is treated with antibiotic medicines. These may be given as a pill, a vaginal cream, or a medicine that is put into the vagina (suppository). This information is not intended to replace advice given to you by your health care provider. Make sure you discuss any questions you have with your health care provider. Document Revised: 10/20/2017 Document Reviewed: 07/23/2016 Elsevier Patient Education  2020 Elsevier Inc.  

## 2020-04-11 NOTE — MAU Note (Signed)
Pt reports to MAU with contractions and sharp pain between legs, cramping in back and abdomen. Contractions started at 5pm and have been getting worse.

## 2020-04-11 NOTE — MAU Provider Note (Signed)
History     CSN: [id number]  Arrival date and time: 04/11/20 2049   First Provider Initiated Contact with Patient 04/11/20 2137      Chief Complaint  Patient presents with  . Contractions   Krista Rodriguez is a 34 y.o. G3P2002 at [redacted]w[redacted]d who receives care at [facility].  She presents today for Contractions.  She states the contractions started around 5pm and have been ongoing.  She states she was laying down at the time of onset and tried changing sides, emptying her bladder, and resting without resolution.  She states the contractions "were really bad at home and in my back and down low." She states she wasn't sure if it was [facility] Contractions and reports that her stomach is not as hard as it was before. Patient endorses fetal movement and denies vaginal discharge. She denies sexual activity in the past 3 days.      OB History    Gravida  3   Para  2   Term  2   Preterm      AB      Living  2     SAB      TAB      Ectopic      Multiple      Live Births              Past Medical History:  Diagnosis Date  . Abdominal pain   . Abnormal vaginal bleeding   . Acute sinusitis   . Blurry vision   . Chest wall pain   . Endometriosis   . Galactorrhea   . Gastroenteritis   . Headache   . Heart murmur   . Heart palpitations 06/07/2018  . Intermittent palpitations   . Irregular periods   . Irritable bowel syndrome   . Menometrorrhagia   . Myalgia   . Nausea   . Pelvic pain in female   . Pinguecula, bilateral   . Positive Lyme disease serology   . Rib pain on right side   . Screening for depression   . Sebaceous cyst   . Skin tag   . Sprain of shoulder, right   . Strain of left trapezius muscle   . Syncope   . Tick bite   . Tobacco user   . Toe laceration   . UTI (urinary tract infection)   . Vasovagal syncope 06/07/2018    Past Surgical History:  Procedure Laterality Date  . addenoidectomy    . TONSILLECTOMY    . WISDOM TOOTH EXTRACTION       History reviewed. No pertinent family history.  Social History   Tobacco Use  . Smoking status: Former Smoker    Packs/day: 1.00    Types: Cigarettes    Quit date: 05/04/2018    Years since quitting: 1.9  . Smokeless tobacco: Never Used  Substance Use Topics  . Alcohol use: Not Currently  . Drug use: No    Allergies:  Allergies  Allergen Reactions  . Icy Hot Rash    No medications prior to admission.    Review of Systems  Gastrointestinal: Positive for abdominal pain.  Genitourinary: Negative for difficulty urinating, dysuria, vaginal bleeding and vaginal discharge.  Musculoskeletal: Positive for back pain.   Physical Exam   Blood pressure (!) 103/52, pulse 88, temperature 98.3 F (36.8 C), temperature source Oral, resp. rate 18, height 5\' 7"  (1.702 m), weight 83.2 kg, last menstrual period 08/29/2019, SpO2 99 %.  Physical  Exam  Constitutional: She is oriented to person, place, and time. She appears well-developed and well-nourished. No distress.  HENT:  Head: Normocephalic and atraumatic.  Eyes: Conjunctivae are normal.  Cardiovascular: Normal rate.  Respiratory: Effort normal.  GI: Soft. There is generalized abdominal tenderness.  Abd gravid and appears AGA Generalized abdominal tenderness with increased sensitivity in suprapubic area.   Genitourinary: Cervix exhibits discharge.    No vaginal discharge or bleeding.  No bleeding in the vagina.    Genitourinary Comments: Speculum Exam: -Normal External Genitalia: Non tender, no apparent discharge at introitus.  -Vaginal Vault: Pink mucosa with good rugae. Scant amt vaginal discharge -fFN and wet prep collected -Cervix:Pink, no lesions, cysts, or polyps.  Appears closed open with small amt yellowish white discharge from os. No active bleeding from os-GC/CT collected -Bimanual Exam: Dilation: Closed internally Effacement (%): Thick    Musculoskeletal:        General: Normal range of motion.     Cervical  back: Normal range of motion.  Neurological: She is alert and oriented to person, place, and time.  Skin: Skin is warm and dry.  Psychiatric: She has a normal mood and affect. Her behavior is normal.    Fetal Assessment 150 bpm, Mod Var, -Decels, +Accels Toco: Ctx q 3-2min  MAU Course   Results for orders placed or performed during the hospital encounter of 04/11/20 (from the past 24 hour(s))  Urinalysis, Routine w reflex microscopic     Status: Abnormal   Collection Time: 04/11/20  9:20 PM  Result Value Ref Range   Color, Urine STRAW (A) YELLOW   APPearance CLEAR CLEAR   Specific Gravity, Urine 1.004 (L) 1.005 - 1.030   pH 6.0 5.0 - 8.0   Glucose, UA NEGATIVE NEGATIVE mg/dL   Hgb urine dipstick NEGATIVE NEGATIVE   Bilirubin Urine NEGATIVE NEGATIVE   Ketones, ur NEGATIVE NEGATIVE mg/dL   Protein, ur NEGATIVE NEGATIVE mg/dL   Nitrite NEGATIVE NEGATIVE   Leukocytes,Ua NEGATIVE NEGATIVE  Fetal fibronectin     Status: None   Collection Time: 04/11/20  9:55 PM  Result Value Ref Range   Fetal Fibronectin NEGATIVE NEGATIVE  Wet prep, genital     Status: Abnormal   Collection Time: 04/11/20  9:55 PM  Result Value Ref Range   Yeast Wet Prep HPF POC NONE SEEN NONE SEEN   Trich, Wet Prep NONE SEEN NONE SEEN   Clue Cells Wet Prep HPF POC PRESENT (A) NONE SEEN   WBC, Wet Prep HPF POC MANY (A) NONE SEEN   Sperm NONE SEEN    No results found.  MDM PE Labs:UA, fFN, GC/CT, Wet prep EFM  Assessment and Plan  34 year old G3P2002  SIUP at 32.2weeks Cat I FT Contractions Back Pain  -POC reviewed. -Discussed fetal fibronectin test including how it is collected, result interpretations, and why we do it. -Patient discusses BMZ  and informed that plan would be for BMZ dosing if fFN returns positive. -Patient states that she does not desire BMZ dosing.  Provider confirms that patient understands what fFN determines and what BMZ dosing is.  Patient confirms. -Provider reassures patient  that she is an allowed to refuse any and all care as she deems appropriate for her well-being. -Provider states that fFN would therefore be deferred and patient requests that it be performed.  Provider agreeable.  -Patient and husband without further questions.  -Exam performed and findings discussed. -Patient offered and declines medication for pain. -Will await results and  reassess.  -NST Reactive  NAVADA OSTERHOUT MSN, CNM 04/11/2020, 9:37 PM   Reassessment (10:53 PM) Bacterial Vaginosis  -Wet prep returns significant for clue cells -Results discussed with patient. -Discussed how bv is contracted and ways to avoid it in the future. -Reviewed how it has been linked to increase risk of preterm contractions and delivery. -Patient questions if it will resolve without treatment and informed that that is unlikely. -Reviewed treatment methods and patient opts for vaginal gel.  -Rx for Metrogel 0.75% PV QHS x 5days sent to pharmacy on file.  -Reviewed other results and patient without questions or concerns. -Husband questions when contractions will subside and informed that some improvement noted after initiation of treatment. -Patient offered medication for contraction cessation and declines.  -Encouraged to call or return to MAU if symptoms worsen or with the onset of new symptoms. -Discharged to home in stable condition.  Cherre Robins MSN, CNM Advanced Practice Provider, [facility] [employer]

## 2020-04-13 LAB — GC/CHLAMYDIA PROBE AMP (~~LOC~~) NOT AT ARMC
Chlamydia: NEGATIVE
Comment: NEGATIVE
Comment: NORMAL
Neisseria Gonorrhea: NEGATIVE

## 2020-05-15 IMAGING — DX DG CHEST 2V
2 series · 2 of 2 positions shown · non-contrast
Comparison: Chest x-rays dated 09/24/2017 and 01/08/2007

CLINICAL DATA: Weakness and intermittent dizziness.

EXAM:
CHEST - 2 VIEW

[w chest pa]
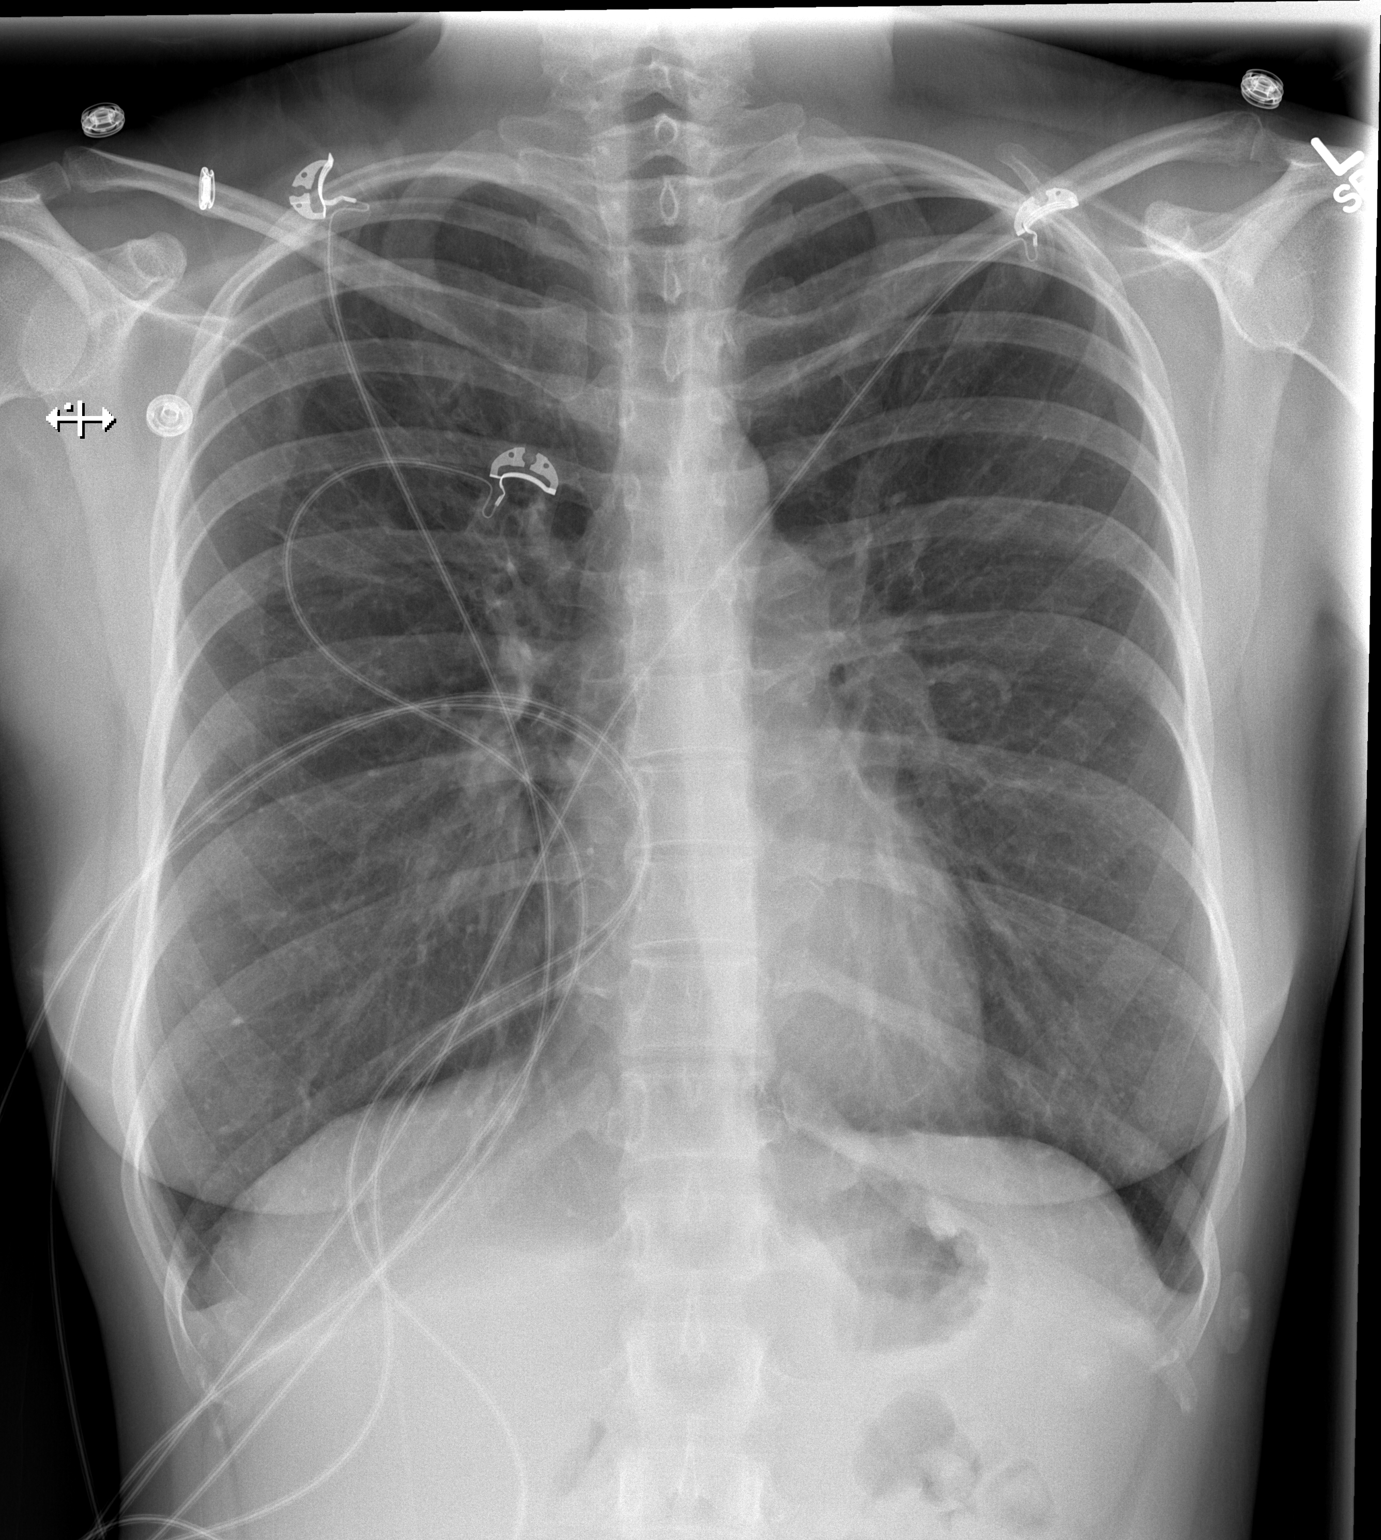

[w chest lat]
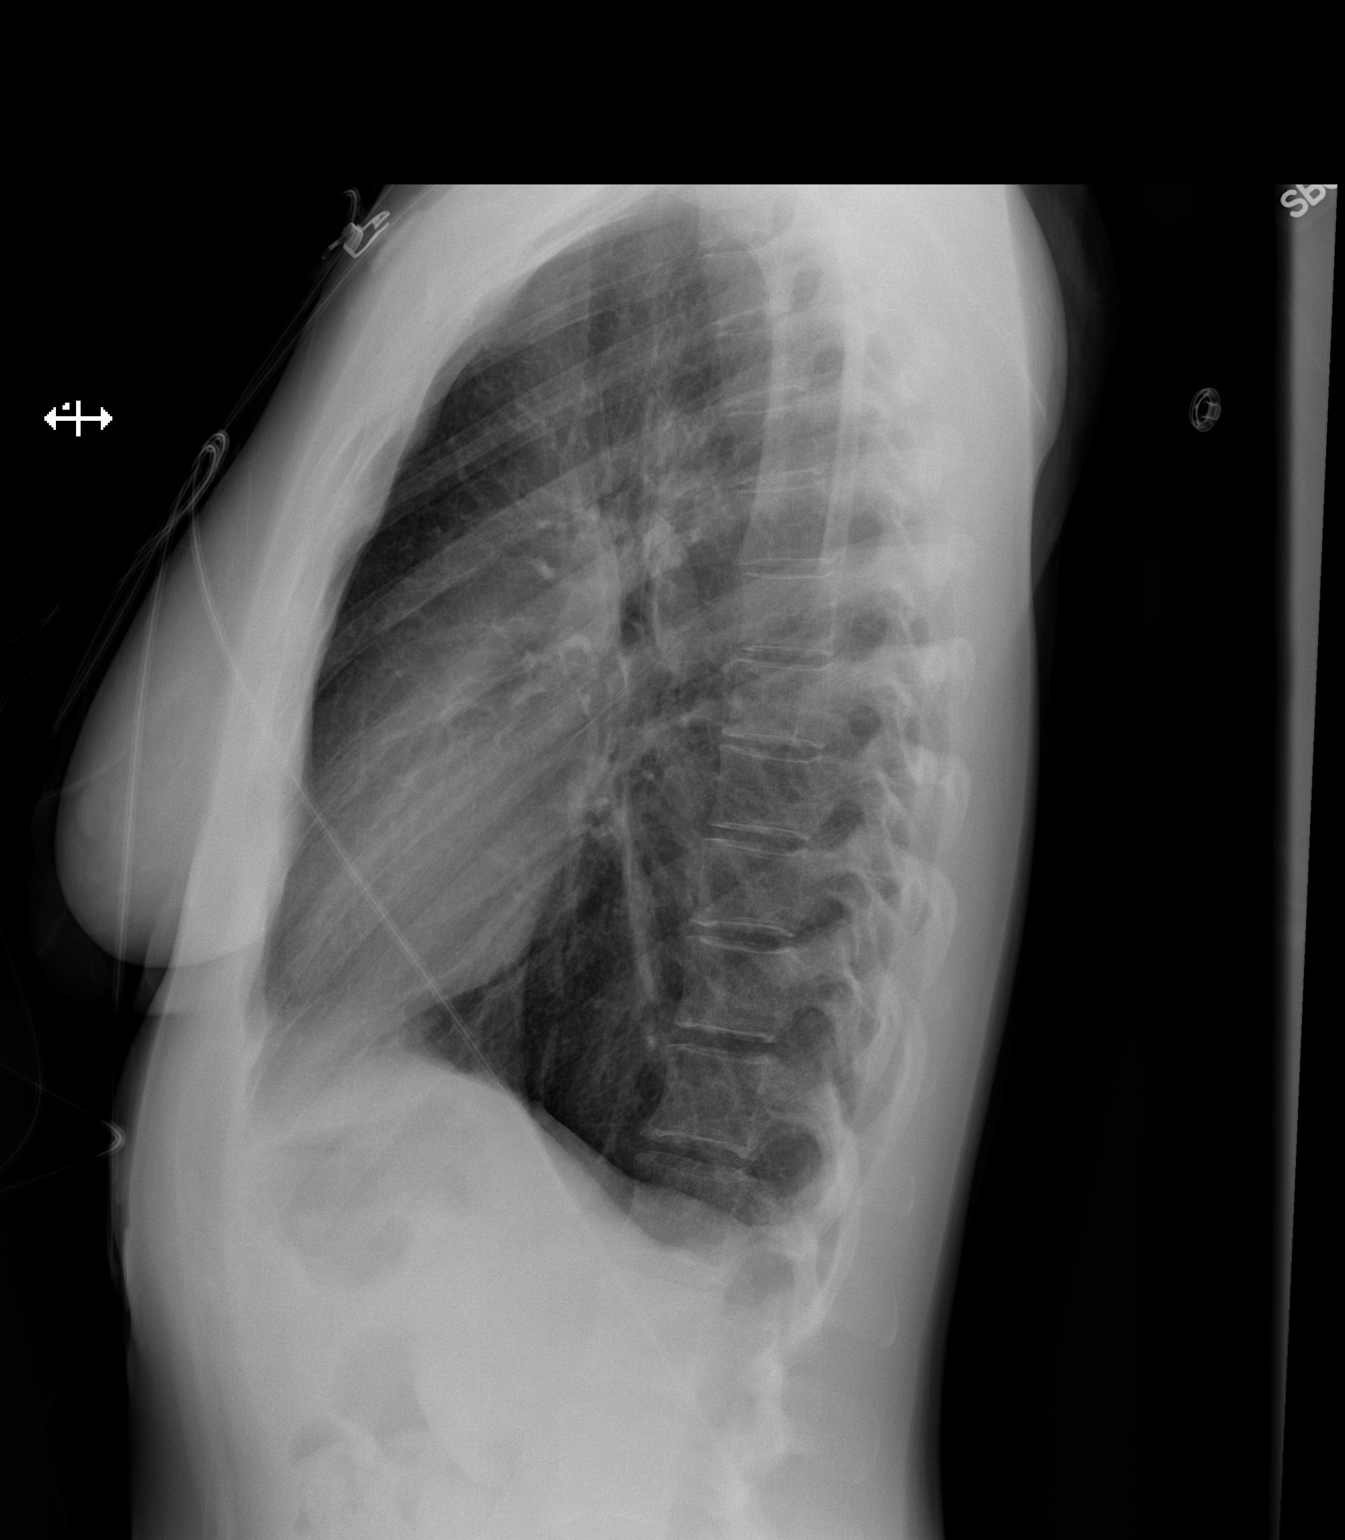

[2 of 2 positions shown; findings below may reference images not displayed]

FINDINGS: The heart size and mediastinal contours are within normal limits.
Both lungs are clear. The visualized skeletal structures are
unremarkable.
IMPRESSION: Normal exam.

## 2020-06-02 ENCOUNTER — Inpatient Hospital Stay (HOSPITAL_COMMUNITY)
Admission: AD | Admit: 2020-06-02 | Discharge: 2020-06-03 | Disposition: A | Discharge status: Home / Self Care | Payer: Medicaid Other | Attending: Obstetrics and Gynecology | Admitting: Obstetrics and Gynecology

## 2020-06-02 ENCOUNTER — Encounter (HOSPITAL_COMMUNITY): Payer: Self-pay | Admitting: Obstetrics and Gynecology

## 2020-06-02 DIAGNOSIS — O479 False labor, unspecified: Secondary | ICD-10-CM

## 2020-06-02 DIAGNOSIS — Z3689 Encounter for other specified antenatal screening: Secondary | ICD-10-CM

## 2020-06-02 DIAGNOSIS — Z3A39 39 weeks gestation of pregnancy: Secondary | ICD-10-CM

## 2020-06-02 DIAGNOSIS — O471 False labor at or after 37 completed weeks of gestation: Secondary | ICD-10-CM | POA: Insufficient documentation

## 2020-06-02 NOTE — MAU Note (Signed)
Pt reports lower back pain . C/o cramping in her stomach and down her legs and increased pelvic pressure. Denies any vag bleeding or leaking. Fetal movement less than usual. 2/30 in office last week.

## 2020-06-03 DIAGNOSIS — Z3A39 39 weeks gestation of pregnancy: Secondary | ICD-10-CM | POA: Diagnosis not present

## 2020-06-03 DIAGNOSIS — O471 False labor at or after 37 completed weeks of gestation: Secondary | ICD-10-CM

## 2020-06-03 NOTE — Discharge Instructions (Signed)

## 2020-06-03 NOTE — MAU Provider Note (Signed)
S: Ms. Krista Rodriguez is a 34 y.o. [id number] at [redacted]w[redacted]d  who presents to MAU today for labor evaluation.     Cervical exam by RN: no cervical change after 1 hour  Dilation: 2 Effacement (%): 60 Cervical Position: Posterior Station: -3 Presentation: Vertex Exam by:: K.Wilosn,RN  Fetal Monitoring: Baseline: 130 Variability: moderate Accelerations: present Decelerations: none Contractions: 4-6   MDM Discussed patient with RN. NST reviewed.   A: SIUP at [redacted]w[redacted]d  False labor  P: Discharge home Labor precautions and kick counts included in AVS Patient to follow-up with [facility] as scheduled  Patient may return to MAU as needed or when in labor   Sharyon Cable, [place] 06/03/2020 12:34 AM

## 2020-06-05 ENCOUNTER — Other Ambulatory Visit: Payer: Self-pay | Admitting: Obstetrics and Gynecology

## 2020-06-08 ENCOUNTER — Telehealth (HOSPITAL_COMMUNITY): Payer: Self-pay | Admitting: *Deleted

## 2020-06-08 ENCOUNTER — Encounter (HOSPITAL_COMMUNITY): Payer: Self-pay | Admitting: *Deleted

## 2020-06-08 NOTE — Telephone Encounter (Signed)
Preadmission screen  

## 2020-06-09 ENCOUNTER — Other Ambulatory Visit (HOSPITAL_COMMUNITY)
Admission: RE | Admit: 2020-06-09 | Discharge: 2020-06-09 | Disposition: A | Discharge status: Home / Self Care | Payer: Medicaid Other | Source: Ambulatory Visit | Attending: Obstetrics and Gynecology | Admitting: Obstetrics and Gynecology

## 2020-06-09 LAB — SARS CORONAVIRUS 2 (TAT 6-24 HRS): SARS Coronavirus 2: NEGATIVE

## 2020-06-10 ENCOUNTER — Other Ambulatory Visit: Payer: Self-pay

## 2020-06-10 ENCOUNTER — Inpatient Hospital Stay (HOSPITAL_COMMUNITY): Payer: Medicaid Other | Admitting: Anesthesiology

## 2020-06-10 ENCOUNTER — Inpatient Hospital Stay (HOSPITAL_COMMUNITY)
Admission: AD | Admit: 2020-06-10 | Discharge: 2020-06-12 | DRG: 807 | Disposition: A | Discharge status: Home / Self Care | Payer: Medicaid Other | Attending: Obstetrics | Admitting: Obstetrics

## 2020-06-10 ENCOUNTER — Encounter (HOSPITAL_COMMUNITY): Payer: Self-pay | Admitting: Obstetrics and Gynecology

## 2020-06-10 DIAGNOSIS — Z3A40 40 weeks gestation of pregnancy: Secondary | ICD-10-CM | POA: Diagnosis not present

## 2020-06-10 DIAGNOSIS — O9902 Anemia complicating childbirth: Principal | ICD-10-CM | POA: Diagnosis present

## 2020-06-10 DIAGNOSIS — Z87891 Personal history of nicotine dependence: Secondary | ICD-10-CM | POA: Diagnosis not present

## 2020-06-10 DIAGNOSIS — Z3490 Encounter for supervision of normal pregnancy, unspecified, unspecified trimester: Secondary | ICD-10-CM

## 2020-06-10 DIAGNOSIS — Z20822 Contact with and (suspected) exposure to covid-19: Secondary | ICD-10-CM | POA: Diagnosis present

## 2020-06-10 DIAGNOSIS — D573 Sickle-cell trait: Secondary | ICD-10-CM | POA: Diagnosis present

## 2020-06-10 DIAGNOSIS — O26893 Other specified pregnancy related conditions, third trimester: Secondary | ICD-10-CM | POA: Diagnosis present

## 2020-06-10 LAB — CBC
HCT: 31.4 % — ABNORMAL LOW (ref 36.0–46.0)
Hemoglobin: 10.6 g/dL — ABNORMAL LOW (ref 12.0–15.0)
MCH: 29.4 pg (ref 26.0–34.0)
MCHC: 33.8 g/dL (ref 30.0–36.0)
MCV: 87 fL (ref 80.0–100.0)
Platelets: 233 10*3/uL (ref 150–400)
RBC: 3.61 MIL/uL — ABNORMAL LOW (ref 3.87–5.11)
RDW: 13.4 % (ref 11.5–15.5)
WBC: 11.2 10*3/uL — ABNORMAL HIGH (ref 4.0–10.5)
nRBC: 0 % (ref 0.0–0.2)

## 2020-06-10 LAB — OB RESULTS CONSOLE GBS: GBS: NEGATIVE

## 2020-06-10 LAB — RPR: RPR Ser Ql: NONREACTIVE

## 2020-06-10 LAB — TYPE AND SCREEN
ABO/RH(D): O POS
Antibody Screen: NEGATIVE

## 2020-06-10 MED ORDER — DIPHENHYDRAMINE HCL 50 MG/ML IJ SOLN: 12.5000 mg | INTRAMUSCULAR | Status: DC | PRN

## 2020-06-10 MED ORDER — OXYTOCIN BOLUS FROM INFUSION
333.0000 mL | Freq: Once | INTRAVENOUS | Status: AC
  Administered 2020-06-10: 333 mL via INTRAVENOUS

## 2020-06-10 MED ORDER — PHENYLEPHRINE 40 MCG/ML (10ML) SYRINGE FOR IV PUSH (FOR BLOOD PRESSURE SUPPORT): 80.0000 ug | PREFILLED_SYRINGE | INTRAVENOUS | Status: DC | PRN

## 2020-06-10 MED ORDER — SENNOSIDES-DOCUSATE SODIUM 8.6-50 MG PO TABS
2.0000 | ORAL_TABLET | ORAL | Status: DC
  Filled 2020-06-10: qty 2

## 2020-06-10 MED ORDER — ACETAMINOPHEN 325 MG PO TABS: 650.0000 mg | ORAL_TABLET | ORAL | Status: DC | PRN

## 2020-06-10 MED ORDER — IBUPROFEN 600 MG PO TABS
600.0000 mg | ORAL_TABLET | Freq: Four times a day (QID) | ORAL | Status: DC | PRN
  Filled 2020-06-10: qty 1

## 2020-06-10 MED ORDER — OXYTOCIN-SODIUM CHLORIDE 30-0.9 UT/500ML-% IV SOLN
2.5000 [IU]/h | INTRAVENOUS | Status: DC
  Filled 2020-06-10: qty 500

## 2020-06-10 MED ORDER — SODIUM CHLORIDE (PF) 0.9 % IJ SOLN
INTRAMUSCULAR | Status: DC | PRN
  Administered 2020-06-10: 12 mL/h via EPIDURAL

## 2020-06-10 MED ORDER — LACTATED RINGERS IV SOLN: 500.0000 mL | INTRAVENOUS | Status: DC | PRN

## 2020-06-10 MED ORDER — DIPHENHYDRAMINE HCL 25 MG PO CAPS: 25.0000 mg | ORAL_CAPSULE | Freq: Four times a day (QID) | ORAL | Status: DC | PRN

## 2020-06-10 MED ORDER — ONDANSETRON HCL 4 MG/2ML IJ SOLN: 4.0000 mg | INTRAMUSCULAR | Status: DC | PRN

## 2020-06-10 MED ORDER — OXYCODONE-ACETAMINOPHEN 5-325 MG PO TABS: 2.0000 | ORAL_TABLET | ORAL | Status: DC | PRN

## 2020-06-10 MED ORDER — EPHEDRINE 5 MG/ML INJ: 10.0000 mg | INTRAVENOUS | Status: DC | PRN

## 2020-06-10 MED ORDER — LACTATED RINGERS IV SOLN: 500.0000 mL | Freq: Once | INTRAVENOUS | Status: DC

## 2020-06-10 MED ORDER — WITCH HAZEL-GLYCERIN EX PADS: 1.0000 "application " | MEDICATED_PAD | CUTANEOUS | Status: DC | PRN

## 2020-06-10 MED ORDER — OXYCODONE-ACETAMINOPHEN 5-325 MG PO TABS: 1.0000 | ORAL_TABLET | ORAL | Status: DC | PRN

## 2020-06-10 MED ORDER — FENTANYL-BUPIVACAINE-NACL 0.5-0.125-0.9 MG/250ML-% EP SOLN: 12.0000 mL/h | EPIDURAL | Status: DC | PRN

## 2020-06-10 MED ORDER — SIMETHICONE 80 MG PO CHEW: 80.0000 mg | CHEWABLE_TABLET | ORAL | Status: DC | PRN

## 2020-06-10 MED ORDER — DIBUCAINE (PERIANAL) 1 % EX OINT: 1.0000 "application " | TOPICAL_OINTMENT | CUTANEOUS | Status: DC | PRN

## 2020-06-10 MED ORDER — PRENATAL MULTIVITAMIN CH: 1.0000 | ORAL_TABLET | Freq: Every day | ORAL | Status: DC

## 2020-06-10 MED ORDER — LIDOCAINE HCL (PF) 1 % IJ SOLN: 30.0000 mL | INTRAMUSCULAR | Status: DC | PRN

## 2020-06-10 MED ORDER — SOD CITRATE-CITRIC ACID 500-334 MG/5ML PO SOLN: 30.0000 mL | ORAL | Status: DC | PRN

## 2020-06-10 MED ORDER — ONDANSETRON HCL 4 MG PO TABS: 4.0000 mg | ORAL_TABLET | ORAL | Status: DC | PRN

## 2020-06-10 MED ORDER — TETANUS-DIPHTH-ACELL PERTUSSIS 5-2.5-18.5 LF-MCG/0.5 IM SUSP: 0.5000 mL | Freq: Once | INTRAMUSCULAR | Status: DC

## 2020-06-10 MED ORDER — ONDANSETRON HCL 4 MG/2ML IJ SOLN: 4.0000 mg | Freq: Four times a day (QID) | INTRAMUSCULAR | Status: DC | PRN

## 2020-06-10 MED ORDER — LIDOCAINE HCL (PF) 1 % IJ SOLN
INTRAMUSCULAR | Status: DC | PRN
  Administered 2020-06-10: 5 mL via EPIDURAL
  Administered 2020-06-10: 3 mL via EPIDURAL

## 2020-06-10 MED ORDER — COCONUT OIL OIL: 1.0000 "application " | TOPICAL_OIL | Status: DC | PRN

## 2020-06-10 MED ORDER — LACTATED RINGERS IV SOLN: INTRAVENOUS | Status: DC

## 2020-06-10 MED ORDER — FENTANYL CITRATE (PF) 100 MCG/2ML IJ SOLN: 50.0000 ug | INTRAMUSCULAR | Status: DC | PRN

## 2020-06-10 MED ORDER — FENTANYL-BUPIVACAINE-NACL 0.5-0.125-0.9 MG/250ML-% EP SOLN
EPIDURAL | Status: AC
  Filled 2020-06-10: qty 250

## 2020-06-10 MED ORDER — BENZOCAINE-MENTHOL 20-0.5 % EX AERO
1.0000 "application " | INHALATION_SPRAY | CUTANEOUS | Status: DC | PRN
  Administered 2020-06-10: 1 via TOPICAL
  Filled 2020-06-10: qty 56

## 2020-06-10 NOTE — MAU Note (Signed)
CTX 2-5 mins apart.  Denies LOF.  Reports having bloody show.  3-4 cm yesterday.  + FM.  Denies complications w/ her pregnancy.

## 2020-06-10 NOTE — Progress Notes (Signed)
Patient getting uncomfortable w contractions.  Consents to amniotomy  BP 115/71    Pulse 89    Temp 98 F (36.7 C) (Oral)    Resp 18    Wt 88.6 kg    LMP 08/29/2019 (Exact Date)    SpO2 98%    BMI 30.59 kg/m    Toco: q3-5 min EFM:  140s, moderate variability, + accelerations, category 1 SVE: 7/90/-1, AROM clear fluid  A/P:  G3P2 @ [redacted]w[redacted]d with labor Anticipate SVD Pitocin as needed

## 2020-06-10 NOTE — Anesthesia Procedure Notes (Signed)
Epidural Patient location during procedure: OB Start time: 06/10/2020 4:57 PM End time: 06/10/2020 5:09 PM  Staffing Anesthesiologist: Achille Rich, MD Performed: anesthesiologist   Preanesthetic Checklist Completed: patient identified, IV checked, site marked, risks and benefits discussed, monitors and equipment checked, pre-op evaluation and timeout performed  Epidural Patient position: sitting Prep: DuraPrep Patient monitoring: heart rate, cardiac monitor, continuous pulse ox and blood pressure Approach: midline Location: L2-L3 Injection technique: LOR saline  Needle:  Needle type: Tuohy  Needle gauge: 17 G Needle length: 9 cm Needle insertion depth: 5 cm Catheter type: closed end flexible Catheter size: 19 Gauge Catheter at skin depth: 11 cm Test dose: negative and Other  Assessment Events: blood not aspirated, injection not painful, no injection resistance and negative IV test  Additional Notes Informed consent obtained prior to proceeding including risk of failure, 1% risk of PDPH, risk of minor discomfort and bruising.  Discussed rare but serious complications including epidural abscess, permanent nerve injury, epidural hematoma.  Discussed alternatives to epidural analgesia and patient desires to proceed.  Timeout performed pre-procedure verifying patient name, procedure, and platelet count.  Patient tolerated procedure well. Reason for block:procedure for pain

## 2020-06-10 NOTE — H&P (Signed)
34 y.o. [id number] @ [redacted]w[redacted]d presents with labor.  Otherwise has good fetal movement and no bleeding.  Pregnancy c/b: 1. Sickle cell trait.  FOB declined testing  Past Medical History:  Diagnosis Date  . Endometriosis   . Galactorrhea   . Headache   . Heart murmur   . Heart palpitations 06/07/2018  . Menometrorrhagia   . Pinguecula, bilateral   . Positive Lyme disease serology   . Sebaceous cyst   . Sprain of shoulder, right   . Strain of left trapezius muscle   . Syncope   . Tobacco user   . Vasovagal syncope 06/07/2018    Past Surgical History:  Procedure Laterality Date  . addenoidectomy    . TONSILLECTOMY    . WISDOM TOOTH EXTRACTION      OB History  Gravida Para Term Preterm AB Living  3 2 2     2   SAB TAB Ectopic Multiple Live Births               # Outcome Date GA Lbr Len/2nd Weight Sex Delivery Anes PTL Lv  3 Current           2 Term      Vag-Spont     1 Term      Vag-Spont       Social History   Socioeconomic History  . Marital status: Married    Spouse name: Not on file  . Number of children: Not on file  . Years of education: Not on file  . Highest education level: Not on file  Occupational History  . Not on file  Tobacco Use  . Smoking status: Former Smoker    Packs/day: 1.00    Types: Cigarettes    Quit date: 05/04/2018    Years since quitting: 2.1  . Smokeless tobacco: Never Used  Vaping Use  . Vaping Use: Never used  Substance and Sexual Activity  . Alcohol use: Not Currently  . Drug use: No  . Sexual activity: Yes    Birth control/protection: None  Other Topics Concern  . Not on file  Social History Narrative  . Not on file   Social Determinants of Health      Icy hot and Latex    Prenatal Transfer Tool  Maternal Diabetes: No Genetic Screening: Declined Maternal Ultrasounds/Referrals: Normal Fetal Ultrasounds or other Referrals:  None Maternal Substance Abuse:  No Significant Maternal Medications:  None Significant Maternal Lab  Results: Group B Strep negative  ABO, Rh: --/--/O POS (07/21 0510) Antibody: NEG (07/21 0510) Rubella: Immune (11/15 0000) RPR: Nonreactive (11/15 0000)  HBsAg: Negative (11/15 0000)  HIV: NON REACTIVE (11/15 1325)  GBS: Negative/-- (07/21 0000)     Vitals:   06/10/20 0620 06/10/20 0702  BP: (!) 100/54 112/65  Pulse: 71 93  Resp: 17 17  Temp:    SpO2:       General:  NAD Abdomen:  soft, gravid, EFW 7.5-8# Ex:  no edema SVE:  5/70/-1 FHTs:  120s, moderate variability, + accels, category 1 Toco:  q2-7 minutes   A/P   34 y.o. 34 [redacted]w[redacted]d presents with early labor Still in latent labor.  Declines amniotomy or pitocin at this time  FSR/ vtx/ GBS negative  Krista Rodriguez Krista Rodriguez Krista Rodriguez

## 2020-06-10 NOTE — Anesthesia Preprocedure Evaluation (Signed)
Anesthesia Evaluation  Patient identified by MRN, date of birth, ID band Patient awake    Reviewed: Allergy & Precautions, H&P , NPO status , Patient's Chart, lab work & pertinent test results  Airway Mallampati: II   Neck ROM: full    Dental   Pulmonary former smoker,    breath sounds clear to auscultation       Cardiovascular negative cardio ROS   Rhythm:regular Rate:Normal     Neuro/Psych  Headaches,  Neuromuscular disease    GI/Hepatic   Endo/Other    Renal/GU      Musculoskeletal   Abdominal   Peds  Hematology   Anesthesia Other Findings   Reproductive/Obstetrics (+) Pregnancy                             Anesthesia Physical Anesthesia Plan  ASA: II  Anesthesia Plan: Epidural   Post-op Pain Management:    Induction: Intravenous  PONV Risk Score and Plan: 2 and Treatment may vary due to age or medical condition  Airway Management Planned: Natural Airway  Additional Equipment:   Intra-op Plan:   Post-operative Plan:   Informed Consent: I have reviewed the patients History and Physical, chart, labs and discussed the procedure including the risks, benefits and alternatives for the proposed anesthesia with the patient or authorized representative who has indicated his/her understanding and acceptance.       Plan Discussed with: Anesthesiologist  Anesthesia Plan Comments:         Anesthesia Quick Evaluation

## 2020-06-11 ENCOUNTER — Inpatient Hospital Stay (HOSPITAL_COMMUNITY)
Admission: AD | Admit: 2020-06-11 | Payer: Medicaid Other | Source: Home / Self Care | Admitting: Obstetrics and Gynecology

## 2020-06-11 ENCOUNTER — Inpatient Hospital Stay (HOSPITAL_COMMUNITY): Payer: Medicaid Other

## 2020-06-11 LAB — CBC
HCT: 29.3 % — ABNORMAL LOW (ref 36.0–46.0)
Hemoglobin: 10 g/dL — ABNORMAL LOW (ref 12.0–15.0)
MCH: 29.2 pg (ref 26.0–34.0)
MCHC: 34.1 g/dL (ref 30.0–36.0)
MCV: 85.4 fL (ref 80.0–100.0)
Platelets: 221 10*3/uL (ref 150–400)
RBC: 3.43 MIL/uL — ABNORMAL LOW (ref 3.87–5.11)
RDW: 13.5 % (ref 11.5–15.5)
WBC: 13.2 10*3/uL — ABNORMAL HIGH (ref 4.0–10.5)
nRBC: 0 % (ref 0.0–0.2)

## 2020-06-11 NOTE — Progress Notes (Signed)
Patient is eating, ambulating, voiding.  Pain control is good.  Appropriate lochia, no complaints.  Vitals:   06/10/20 2140 06/10/20 2240 06/11/20 0300 06/11/20 0755  BP: 110/68 116/63 97/67 100/60  Pulse: 78 97 71 73  Resp: 15 16 15 16   Temp: 98.2 F (36.8 C) 98.2 F (36.8 C) 98.5 F (36.9 C)   TempSrc: Oral Oral Oral   SpO2: 99% 99% 98% 97%  Weight:        Fundus firm Abd: soft, nontender Ext: no calf tenderness  Lab Results  Component Value Date   WBC 13.2 (H) 06/11/2020   HGB 10.0 (L) 06/11/2020   HCT 29.3 (L) 06/11/2020   MCV 85.4 06/11/2020   PLT 221 06/11/2020    --/--/O POS (07/21 0510)  A/P Post partum day #1. Doing well.  Routine care.  Expect d/c 7/23.    8/23

## 2020-06-11 NOTE — Anesthesia Postprocedure Evaluation (Signed)
Anesthesia Post Note  Patient: INDYAH SAULNIER  Procedure(s) Performed: AN AD HOC LABOR EPIDURAL     Patient location during evaluation: Mother Baby Anesthesia Type: Epidural Level of consciousness: awake and alert Pain management: pain level controlled Vital Signs Assessment: post-procedure vital signs reviewed and stable Respiratory status: spontaneous breathing, nonlabored ventilation and respiratory function stable Cardiovascular status: stable Postop Assessment: no headache, no backache, epidural receding, no apparent nausea or vomiting, patient able to bend at knees, adequate PO intake and able to ambulate Anesthetic complications: no   No complications documented.  Last Vitals:  Vitals:   06/11/20 0300 06/11/20 0755  BP: 97/67 100/60  Pulse: 71 73  Resp: 15 16  Temp: 36.9 C   SpO2: 98% 97%    Last Pain:  Vitals:   06/11/20 0815  TempSrc:   PainSc: 0-No pain   Pain Goal:                   [place] [employer]

## 2020-06-11 NOTE — Progress Notes (Signed)
CSW received consult for "patient admits to drinking 1-2 beers a day".  CSW met with MOB to offer support and complete assessment.    MOB holding infant with FOB present at bedside, when CSW entered the room. CSW introduced self and received verbal permission from MOB to complete assessment with FOB present. Both MOB and FOB pleasant and easy to engage. CSW inquired about MOB's alcohol use during pregnancy to which MOB appeared surprised and shook her head no. Per MOB, she hasn't had a drink in over 3 years. MOB unsure of where that would have come from. CSW inquired about MOB's mental health history to which MOB reported some anxiety and stated she may have experienced PPD with her last pregnancy over 15 years ago. MOB shared an increase in anxiety and depression as due date neared because infant did not deliver until [redacted]w[redacted]d MOB reported having a good support system and denied any current mental health symptoms or concerns. CSW provided education regarding the baby blues period vs. perinatal mood disorders, discussed treatment and gave resources for mental health follow up if concerns arise.  CSW recommends self-evaluation during the postpartum time period using the New Mom Checklist from Postpartum Progress and encouraged MOB to contact a medical professional if symptoms are noted at any time. MOB denied any current SI or HI.  MOB confirmed having all essential items for infant once discharged and stated infant would be sleeping in a pack 'n' play once home. CSW provided review of Sudden Infant Death Syndrome (SIDS) precautions and safe sleeping habits.    CSW identifies no further need for intervention and no barriers to discharge at this time.  MElijio Miles LCSW [facility] CM[facility] [id number] [phone]

## 2020-06-11 NOTE — Progress Notes (Signed)
Red area on inner right thigh.  Pt declines any itching or pain.  RN informed pt to let RN know if area becomes uncomfortable.

## 2020-06-12 NOTE — Lactation Note (Signed)
This note was copied from a baby's chart. Lactation Consultation Note  Patient Name: Krista Rodriguez [id number] Date: 06/12/2020   P3, Baby 36 hours old.  DAT+ Reviewed hand expression with mother with drops expressed. Assisted w/ latching in cross cradle hold versus cradle hold for increased head and breast support. Baby has been cluster feeding.   Encouraged feeding on demand at least 8-12 times per day on both breasts. Reviewed engorgement care and monitoring voids/stools.      Maternal Data    Feeding Feeding Type: Breast Fed  LATCH Score                   Interventions    Lactation Tools Discussed/Used     Consult Status      Hardie Pulley 06/12/2020, 8:20 AM

## 2020-06-12 NOTE — Discharge Summary (Signed)
Postpartum Discharge Summary  Date of Service updated      Patient Name: Krista Rodriguez DOB: [birth year] MRN: [id number]  Date of admission: 06/10/2020 Delivery date:06/10/2020  Delivering provider: Jerelyn Charles  Date of discharge: 06/12/2020  Admitting diagnosis: Term pregnancy [Z34.90] Intrauterine pregnancy: [redacted]w[redacted]d    Secondary diagnosis:  Active Problems:   Term pregnancy  Additional problems: none.    Discharge diagnosis: Term Pregnancy Delivered                                              Post partum procedures:none Augmentation: N/A Complications: None  Hospital course: Onset of Labor With Vaginal Delivery      34y.o. yo G3P3003 at 32w6das admitted in Latent Labor on 06/10/2020. Patient had an uncomplicated labor course as follows:  Membrane Rupture Time/Date: 1:26 PM ,06/10/2020   Delivery Method:Vaginal, Spontaneous  Episiotomy: None  Lacerations:  2nd degree  Patient had an uncomplicated postpartum course.  She is ambulating, tolerating a regular diet, passing flatus, and urinating well. Patient is discharged home in stable condition on 06/12/20.  Newborn Data: Birth date:06/10/2020  Birth time:7:34 PM  Gender:Female  Living status:Living  Apgars:9 ,9  Weight:4329 g   Magnesium Sulfate received: No BMZ received: No Rhophylac:No MMR:No T-DaP:Given prenatally Flu: No Transfusion:No  Physical exam  Vitals:   06/11/20 0755 06/11/20 1307 06/11/20 2003 06/12/20 0510  BP: 100/60 (!) 105/56 (!) 108/62 (!) 99/57  Pulse: 73 67 68 58  Resp: _0 Temp:  98.3 F (36.8 C) 98.2 F (36.8 C) 98 F (36.7 C)  TempSrc:  Oral Oral Oral  SpO2: 97% 96% 96% 96%  Weight:        Labs: Lab Results  Component Value Date   WBC 13.2 (H) 06/11/2020   HGB 10.0 (L) 06/11/2020   HCT 29.3 (L) 06/11/2020   MCV 85.4 06/11/2020   PLT 221 06/11/2020   CMP Latest Ref Rng & Units 05/02/2018  Glucose 65 - 99 mg/dL 86  BUN 6 - 20 mg/dL 8  Creatinine 0.44 - 1.00  mg/dL 0.88  Sodium 135 - 145 mmol/L 139  Potassium 3.5 - 5.1 mmol/L 4.6  Chloride 101 - 111 mmol/L 106  CO2 22 - 32 mmol/L 27  Calcium 8.9 - 10.3 mg/dL 9.4  Total Protein 6.5 - 8.1 g/dL -  Total Bilirubin 0.3 - 1.2 mg/dL -  Alkaline Phos 38 - 126 U/L -  AST 15 - 41 U/L -  ALT 14 - 54 U/L -   Edinburgh Score: Edinburgh Postnatal Depression Scale Screening Tool 06/11/2020  I have been able to laugh and see the funny side of things. 0  I have looked forward with enjoyment to things. 0  I have blamed myself unnecessarily when things went wrong. 2  I have been anxious or worried for no good reason. 2  I have felt scared or panicky for no good reason. 1  Things have been getting on top of me. 1  I have been so unhappy that I have had difficulty sleeping. 1  I have felt sad or miserable. 1  I have been so unhappy that I have been crying. 1  The thought of harming myself has occurred to me. 0  Edinburgh Postnatal Depression Scale Total 9      After visit meds:  Allergies as of 06/12/2020      Reactions   Icy Hot Rash   Latex Rash      Medication List    You have not been prescribed any medications.          Discharge Care Instructions  (From admission, onward)         Start     Ordered   06/12/20 0000  Discharge wound care:       Comments: Sitz baths and icepacks to perineum.  If stitches, they will dissolve.   06/12/20 0805           Discharge home in stable condition Infant Feeding: ? Infant Disposition:home with mother Discharge instruction: per After Visit Summary and Postpartum booklet. Activity: Advance as tolerated. Pelvic rest for 6 weeks.  Diet: routine diet Anticipated Birth Control: Unsure Postpartum Appointment:4 weeks Additional Postpartum F/U: none Future Appointments:No future appointments. Follow up Visit:  Follow-up Information    Jerelyn Charles, MD Follow up in 4 week(s).   Specialty: Obstetrics Contact information: [address] [place] [address] [phone]                   06/12/2020 Daria Pastures, MD

## 2020-06-12 NOTE — Progress Notes (Signed)
Patient is eating, ambulating, voiding.  Pain control is good.  Vitals:   06/11/20 0755 06/11/20 1307 06/11/20 2003 06/12/20 0510  BP: 100/60 (!) 105/56 (!) 108/62 (!) 99/57  Pulse: 73 67 68 58  Resp: 16 17 18 16   Temp:  98.3 F (36.8 C) 98.2 F (36.8 C) 98 F (36.7 C)  TempSrc:  Oral Oral Oral  SpO2: 97% 96% 96% 96%  Weight:        Fundus firm Perineum without swelling.  Lab Results  Component Value Date   WBC 13.2 (H) 06/11/2020   HGB 10.0 (L) 06/11/2020   HCT 29.3 (L) 06/11/2020   MCV 85.4 06/11/2020   PLT 221 06/11/2020    --/--/O POS (07/21 0510)/RI  A/P Post partum day 2.  Routine care.  Expect d/c today.    12-03-2005

## 2020-06-13 ENCOUNTER — Ambulatory Visit: Payer: Self-pay

## 2020-06-13 NOTE — Lactation Note (Signed)
This note was copied from a baby's chart. Lactation Consultation Note  Patient Name: Krista Rodriguez [id number] Date: 06/13/2020   Baby 62 hours old.  10.2% weight loss.  Mother has blister on R nipple. For soreness suggest mother apply ebm or coconut oil and alternate with comfort gels. Mother primarily been breastfeeding on one breast due to soreness and has been using pacifier. Noted mid posterior lingual tightness.  Suggest discussing with Ped MD. Pecola Leisure is now being supplemented with formula. Suggest mother post pump and give volume back to baby. Feed on demand with cues.  Goal 8-12+ times per day after first 24 hrs.  Place baby STS if not cueing.  Reviewed engorgement care and monitoring voids/stools.      Maternal Data    Feeding Feeding Type: Bottle Fed - Formula  LATCH Score                   Interventions    Lactation Tools Discussed/Used     Consult Status      Hardie Pulley 06/13/2020, 10:16 AM
